# Patient Record
Sex: Female | Born: 1935 | Smoking: Never smoker
Health system: Southern US, Community
[De-identification: ages and names within clinical notes are randomized; demographics above are authoritative.]

## PROBLEM LIST (undated history)

## (undated) DIAGNOSIS — F0390 Unspecified dementia without behavioral disturbance: Secondary | ICD-10-CM

## (undated) HISTORY — PX: ABDOMINAL HYSTERECTOMY: SHX81

---

## 2016-08-25 ENCOUNTER — Inpatient Hospital Stay (HOSPITAL_COMMUNITY): Payer: Medicare Other

## 2016-08-25 ENCOUNTER — Encounter (HOSPITAL_COMMUNITY): Payer: Self-pay

## 2016-08-25 ENCOUNTER — Inpatient Hospital Stay (HOSPITAL_COMMUNITY)
Admission: EM | Admit: 2016-08-25 | Discharge: 2016-09-02 | DRG: 870 | Disposition: E | Payer: Medicare Other | Attending: Pulmonary Disease | Admitting: Pulmonary Disease

## 2016-08-25 ENCOUNTER — Emergency Department (HOSPITAL_COMMUNITY): Payer: Medicare Other

## 2016-08-25 DIAGNOSIS — R402312 Coma scale, best motor response, none, at arrival to emergency department: Secondary | ICD-10-CM | POA: Diagnosis present

## 2016-08-25 DIAGNOSIS — D6959 Other secondary thrombocytopenia: Secondary | ICD-10-CM | POA: Diagnosis not present

## 2016-08-25 DIAGNOSIS — R402212 Coma scale, best verbal response, none, at arrival to emergency department: Secondary | ICD-10-CM | POA: Diagnosis present

## 2016-08-25 DIAGNOSIS — E43 Unspecified severe protein-calorie malnutrition: Secondary | ICD-10-CM | POA: Diagnosis present

## 2016-08-25 DIAGNOSIS — J9601 Acute respiratory failure with hypoxia: Secondary | ICD-10-CM

## 2016-08-25 DIAGNOSIS — Z681 Body mass index (BMI) 19 or less, adult: Secondary | ICD-10-CM

## 2016-08-25 DIAGNOSIS — R402112 Coma scale, eyes open, never, at arrival to emergency department: Secondary | ICD-10-CM | POA: Diagnosis present

## 2016-08-25 DIAGNOSIS — J869 Pyothorax without fistula: Secondary | ICD-10-CM | POA: Diagnosis present

## 2016-08-25 DIAGNOSIS — D6489 Other specified anemias: Secondary | ICD-10-CM | POA: Diagnosis present

## 2016-08-25 DIAGNOSIS — M950 Acquired deformity of nose: Secondary | ICD-10-CM | POA: Diagnosis present

## 2016-08-25 DIAGNOSIS — A419 Sepsis, unspecified organism: Principal | ICD-10-CM

## 2016-08-25 DIAGNOSIS — R739 Hyperglycemia, unspecified: Secondary | ICD-10-CM | POA: Diagnosis present

## 2016-08-25 DIAGNOSIS — Z66 Do not resuscitate: Secondary | ICD-10-CM | POA: Diagnosis present

## 2016-08-25 DIAGNOSIS — Z515 Encounter for palliative care: Secondary | ICD-10-CM | POA: Diagnosis present

## 2016-08-25 DIAGNOSIS — I480 Paroxysmal atrial fibrillation: Secondary | ICD-10-CM | POA: Diagnosis not present

## 2016-08-25 DIAGNOSIS — R0600 Dyspnea, unspecified: Secondary | ICD-10-CM | POA: Diagnosis not present

## 2016-08-25 DIAGNOSIS — Z90710 Acquired absence of both cervix and uterus: Secondary | ICD-10-CM | POA: Diagnosis not present

## 2016-08-25 DIAGNOSIS — T68XXXA Hypothermia, initial encounter: Secondary | ICD-10-CM | POA: Diagnosis present

## 2016-08-25 DIAGNOSIS — G9341 Metabolic encephalopathy: Secondary | ICD-10-CM | POA: Diagnosis present

## 2016-08-25 DIAGNOSIS — E871 Hypo-osmolality and hyponatremia: Secondary | ICD-10-CM | POA: Diagnosis present

## 2016-08-25 DIAGNOSIS — F0390 Unspecified dementia without behavioral disturbance: Secondary | ICD-10-CM | POA: Diagnosis present

## 2016-08-25 DIAGNOSIS — R64 Cachexia: Secondary | ICD-10-CM | POA: Diagnosis present

## 2016-08-25 DIAGNOSIS — Z9889 Other specified postprocedural states: Secondary | ICD-10-CM

## 2016-08-25 DIAGNOSIS — J90 Pleural effusion, not elsewhere classified: Secondary | ICD-10-CM

## 2016-08-25 DIAGNOSIS — E872 Acidosis, unspecified: Secondary | ICD-10-CM

## 2016-08-25 DIAGNOSIS — Z4659 Encounter for fitting and adjustment of other gastrointestinal appliance and device: Secondary | ICD-10-CM

## 2016-08-25 DIAGNOSIS — J9690 Respiratory failure, unspecified, unspecified whether with hypoxia or hypercapnia: Secondary | ICD-10-CM | POA: Diagnosis present

## 2016-08-25 DIAGNOSIS — E874 Mixed disorder of acid-base balance: Secondary | ICD-10-CM | POA: Diagnosis present

## 2016-08-25 DIAGNOSIS — E876 Hypokalemia: Secondary | ICD-10-CM | POA: Diagnosis not present

## 2016-08-25 DIAGNOSIS — J939 Pneumothorax, unspecified: Secondary | ICD-10-CM

## 2016-08-25 DIAGNOSIS — R4189 Other symptoms and signs involving cognitive functions and awareness: Secondary | ICD-10-CM

## 2016-08-25 DIAGNOSIS — J690 Pneumonitis due to inhalation of food and vomit: Secondary | ICD-10-CM | POA: Diagnosis present

## 2016-08-25 DIAGNOSIS — R6521 Severe sepsis with septic shock: Secondary | ICD-10-CM | POA: Diagnosis present

## 2016-08-25 DIAGNOSIS — J9602 Acute respiratory failure with hypercapnia: Secondary | ICD-10-CM

## 2016-08-25 DIAGNOSIS — J9600 Acute respiratory failure, unspecified whether with hypoxia or hypercapnia: Secondary | ICD-10-CM | POA: Diagnosis present

## 2016-08-25 HISTORY — DX: Unspecified dementia, unspecified severity, without behavioral disturbance, psychotic disturbance, mood disturbance, and anxiety: F03.90

## 2016-08-25 LAB — BODY FLUID CELL COUNT WITH DIFFERENTIAL
Lymphs, Fluid: 1 %
MONOCYTE-MACROPHAGE-SEROUS FLUID: 2 % — AB (ref 50–90)
Neutrophil Count, Fluid: 97 % — ABNORMAL HIGH (ref 0–25)
Total Nucleated Cell Count, Fluid: 8531 cu mm — ABNORMAL HIGH (ref 0–1000)

## 2016-08-25 LAB — URINALYSIS, ROUTINE W REFLEX MICROSCOPIC
BILIRUBIN URINE: NEGATIVE
Glucose, UA: >=500 mg/dL — AB
KETONES UR: NEGATIVE mg/dL
Nitrite: NEGATIVE
Protein, ur: NEGATIVE mg/dL
Specific Gravity, Urine: 1.01 (ref 1.005–1.030)
pH: 6 (ref 5.0–8.0)

## 2016-08-25 LAB — BLOOD GAS, ARTERIAL
Acid-base deficit: 10.3 mmol/L — ABNORMAL HIGH (ref 0.0–2.0)
Acid-base deficit: 3 mmol/L — ABNORMAL HIGH (ref 0.0–2.0)
BICARBONATE: 20.8 mmol/L (ref 20.0–28.0)
Bicarbonate: 22 mmol/L (ref 20.0–28.0)
Drawn by: 441261
FIO2: 100
FIO2: 40
LHR: 15 {breaths}/min
O2 SAT: 93.2 %
O2 Saturation: 98.4 %
PEEP: 5 cmH2O
PEEP: 5 cmH2O
PH ART: 7.339 — AB (ref 7.350–7.450)
PO2 ART: 252 mmHg — AB (ref 83.0–108.0)
Patient temperature: 95
Patient temperature: 99
Pressure control: 20 cmH2O
RATE: 30 resp/min
VT: 420 mL
pCO2 arterial: 67.4 mmHg (ref 32.0–48.0)
pCO2 arterial: 42.2 mmHg (ref 32.0–48.0)
pH, Arterial: 7.1 — CL (ref 7.350–7.450)
pO2, Arterial: 70.9 mmHg — ABNORMAL LOW (ref 83.0–108.0)

## 2016-08-25 LAB — GLUCOSE, CAPILLARY
GLUCOSE-CAPILLARY: 187 mg/dL — AB (ref 65–99)
GLUCOSE-CAPILLARY: 119 mg/dL — AB (ref 65–99)
GLUCOSE-CAPILLARY: 75 mg/dL (ref 65–99)
Glucose-Capillary: 86 mg/dL (ref 65–99)

## 2016-08-25 LAB — PROCALCITONIN: PROCALCITONIN: 22.68 ng/mL

## 2016-08-25 LAB — CBC WITH DIFFERENTIAL/PLATELET
BAND NEUTROPHILS: 19 %
BASOS ABS: 0 10*3/uL (ref 0.0–0.1)
BLASTS: 0 %
Basophils Relative: 0 %
Eosinophils Absolute: 0 10*3/uL (ref 0.0–0.7)
Eosinophils Relative: 0 %
HEMATOCRIT: 36 % (ref 36.0–46.0)
Hemoglobin: 11.5 g/dL — ABNORMAL LOW (ref 12.0–15.0)
LYMPHS PCT: 2 %
Lymphs Abs: 1.1 10*3/uL (ref 0.7–4.0)
MCH: 30.8 pg (ref 26.0–34.0)
MCHC: 31.9 g/dL (ref 30.0–36.0)
MCV: 96.5 fL (ref 78.0–100.0)
Metamyelocytes Relative: 0 %
Monocytes Absolute: 0.5 10*3/uL (ref 0.1–1.0)
Monocytes Relative: 1 %
Myelocytes: 2 %
NEUTROS ABS: 51.1 10*3/uL — AB (ref 1.7–7.7)
NEUTROS PCT: 76 %
PROMYELOCYTES ABS: 0 %
Platelets: 130 10*3/uL — ABNORMAL LOW (ref 150–400)
RBC: 3.73 MIL/uL — ABNORMAL LOW (ref 3.87–5.11)
RDW: 13.5 % (ref 11.5–15.5)
WBC Morphology: INCREASED
WBC: 52.7 10*3/uL — AB (ref 4.0–10.5)
nRBC: 0 /100 WBC

## 2016-08-25 LAB — I-STAT CHEM 8, ED
BUN: 29 mg/dL — AB (ref 6–20)
CALCIUM ION: 1.02 mmol/L — AB (ref 1.15–1.40)
CREATININE: 0.7 mg/dL (ref 0.44–1.00)
Chloride: 102 mmol/L (ref 101–111)
GLUCOSE: 269 mg/dL — AB (ref 65–99)
HCT: 35 % — ABNORMAL LOW (ref 36.0–46.0)
Hemoglobin: 11.9 g/dL — ABNORMAL LOW (ref 12.0–15.0)
Potassium: 4.4 mmol/L (ref 3.5–5.1)
Sodium: 132 mmol/L — ABNORMAL LOW (ref 135–145)
TCO2: 20 mmol/L — ABNORMAL LOW (ref 22–32)

## 2016-08-25 LAB — COMPREHENSIVE METABOLIC PANEL
ALT: 25 U/L (ref 14–54)
AST: 43 U/L — AB (ref 15–41)
Albumin: 2.1 g/dL — ABNORMAL LOW (ref 3.5–5.0)
Alkaline Phosphatase: 93 U/L (ref 38–126)
Anion gap: 10 (ref 5–15)
BILIRUBIN TOTAL: 1.3 mg/dL — AB (ref 0.3–1.2)
BUN: 24 mg/dL — AB (ref 6–20)
CO2: 22 mmol/L (ref 22–32)
CREATININE: 0.75 mg/dL (ref 0.44–1.00)
Calcium: 8.4 mg/dL — ABNORMAL LOW (ref 8.9–10.3)
Chloride: 102 mmol/L (ref 101–111)
GFR calc Af Amer: >60 mL/min (ref >60)
Glucose, Bld: 274 mg/dL — ABNORMAL HIGH (ref 65–99)
POTASSIUM: 4.2 mmol/L (ref 3.5–5.1)
Sodium: 134 mmol/L — ABNORMAL LOW (ref 135–145)
TOTAL PROTEIN: 4.9 g/dL — AB (ref 6.5–8.1)

## 2016-08-25 LAB — I-STAT TROPONIN, ED: TROPONIN I, POC: 0.02 ng/mL (ref 0.00–0.08)

## 2016-08-25 LAB — CBG MONITORING, ED: Glucose-Capillary: 301 mg/dL — ABNORMAL HIGH (ref 65–99)

## 2016-08-25 LAB — MAGNESIUM
Magnesium: 2.8 mg/dL — ABNORMAL HIGH (ref 1.7–2.4)
Magnesium: 2.8 mg/dL — ABNORMAL HIGH (ref 1.7–2.4)
Magnesium: 2.7 mg/dL — ABNORMAL HIGH (ref 1.7–2.4)

## 2016-08-25 LAB — GLUCOSE, PLEURAL OR PERITONEAL FLUID: Glucose, Fluid: 30 mg/dL

## 2016-08-25 LAB — LACTATE DEHYDROGENASE, PLEURAL OR PERITONEAL FLUID: LD FL: 1028 U/L — AB (ref 3–23)

## 2016-08-25 LAB — FOLATE: Folate: 27.6 ng/mL (ref >5.9)

## 2016-08-25 LAB — I-STAT CG4 LACTIC ACID, ED: Lactic Acid, Venous: 6.74 mmol/L (ref 0.5–1.9)

## 2016-08-25 LAB — TROPONIN I
Troponin I: <0.03 ng/mL (ref <0.03)
Troponin I: 0.03 ng/mL (ref <0.03)

## 2016-08-25 LAB — CORTISOL: Cortisol, Plasma: 73.5 ug/dL

## 2016-08-25 LAB — PROTEIN, PLEURAL OR PERITONEAL FLUID: Total protein, fluid: 4 g/dL

## 2016-08-25 LAB — PHOSPHORUS
PHOSPHORUS: 4.2 mg/dL (ref 2.5–4.6)
PHOSPHORUS: 3.3 mg/dL (ref 2.5–4.6)

## 2016-08-25 LAB — LACTIC ACID, PLASMA: LACTIC ACID, VENOUS: 3.8 mmol/L — AB (ref 0.5–1.9)

## 2016-08-25 LAB — VITAMIN B12: Vitamin B-12: 278 pg/mL (ref 180–914)

## 2016-08-25 LAB — TSH: TSH: 2.468 u[IU]/mL (ref 0.350–4.500)

## 2016-08-25 LAB — MRSA PCR SCREENING: MRSA BY PCR: NEGATIVE

## 2016-08-25 MED ORDER — ETOMIDATE 2 MG/ML IV SOLN
INTRAVENOUS | Status: AC | PRN
  Administered 2016-08-25: 10 mg via INTRAVENOUS

## 2016-08-25 MED ORDER — VANCOMYCIN HCL IN DEXTROSE 1-5 GM/200ML-% IV SOLN
1000.0000 mg | Freq: Once | INTRAVENOUS | Status: AC
  Administered 2016-08-25: 1000 mg via INTRAVENOUS
  Filled 2016-08-25: qty 200

## 2016-08-25 MED ORDER — MIDAZOLAM HCL 2 MG/2ML IJ SOLN: 1.0000 mg | INTRAMUSCULAR | Status: DC | PRN

## 2016-08-25 MED ORDER — SODIUM CHLORIDE 0.9 % IV BOLUS (SEPSIS)
1000.0000 mL | Freq: Once | INTRAVENOUS | Status: AC
  Administered 2016-08-25: 1000 mL via INTRAVENOUS

## 2016-08-25 MED ORDER — LACTATED RINGERS IV SOLN
INTRAVENOUS | Status: DC
  Administered 2016-08-25 – 2016-09-01 (×5): via INTRAVENOUS

## 2016-08-25 MED ORDER — ORAL CARE MOUTH RINSE
15.0000 mL | Freq: Four times a day (QID) | OROMUCOSAL | Status: DC
Start: 2016-08-25 — End: 2016-09-01
  Administered 2016-08-26 – 2016-09-01 (×25): 15 mL via OROMUCOSAL

## 2016-08-25 MED ORDER — PIPERACILLIN-TAZOBACTAM 3.375 G IVPB: 3.3750 g | Freq: Once | INTRAVENOUS | Status: DC

## 2016-08-25 MED ORDER — ROCURONIUM BROMIDE 50 MG/5ML IV SOLN
INTRAVENOUS | Status: AC | PRN
  Administered 2016-08-25: 50 mg via INTRAVENOUS

## 2016-08-25 MED ORDER — SODIUM CHLORIDE 0.9 % IV BOLUS (SEPSIS)
500.0000 mL | Freq: Once | INTRAVENOUS | Status: AC
  Administered 2016-08-25: 500 mL via INTRAVENOUS

## 2016-08-25 MED ORDER — FUROSEMIDE 10 MG/ML IJ SOLN
40.0000 mg | Freq: Once | INTRAMUSCULAR | Status: AC
Start: 2016-08-25 — End: 2016-08-25
  Administered 2016-08-25: 40 mg via INTRAVENOUS

## 2016-08-25 MED ORDER — PANTOPRAZOLE SODIUM 40 MG IV SOLR: 40.0000 mg | Freq: Every day | INTRAVENOUS | Status: DC

## 2016-08-25 MED ORDER — INSULIN ASPART 100 UNIT/ML ~~LOC~~ SOLN
0.0000 [IU] | SUBCUTANEOUS | Status: DC
  Administered 2016-08-26 – 2016-08-27 (×2): 2 [IU] via SUBCUTANEOUS
  Administered 2016-08-27 – 2016-08-28 (×6): 1 [IU] via SUBCUTANEOUS
  Administered 2016-08-28: 2 [IU] via SUBCUTANEOUS
  Administered 2016-08-28: 1 [IU] via SUBCUTANEOUS
  Administered 2016-08-29: 2 [IU] via SUBCUTANEOUS
  Administered 2016-08-29 – 2016-08-30 (×7): 1 [IU] via SUBCUTANEOUS
  Administered 2016-08-30: 2 [IU] via SUBCUTANEOUS
  Administered 2016-08-30 – 2016-08-31 (×4): 1 [IU] via SUBCUTANEOUS
  Administered 2016-08-31 (×2): 2 [IU] via SUBCUTANEOUS
  Administered 2016-08-31 – 2016-09-01 (×3): 1 [IU] via SUBCUTANEOUS

## 2016-08-25 MED ORDER — PRO-STAT SUGAR FREE PO LIQD: 30.0000 mL | Freq: Two times a day (BID) | ORAL | Status: DC

## 2016-08-25 MED ORDER — SODIUM CHLORIDE 0.9 % IV SOLN
500.0000 mg | INTRAVENOUS | Status: DC
  Administered 2016-08-26: 500 mg via INTRAVENOUS
  Filled 2016-08-25: qty 500

## 2016-08-25 MED ORDER — CHLORHEXIDINE GLUCONATE 0.12 % MT SOLN
OROMUCOSAL | Status: AC
  Filled 2016-08-25: qty 15

## 2016-08-25 MED ORDER — NALOXONE HCL 0.4 MG/ML IJ SOLN
INTRAMUSCULAR | Status: DC | PRN
  Administered 2016-08-25: 2 mg via INTRAVENOUS

## 2016-08-25 MED ORDER — LACTATED RINGERS IV BOLUS (SEPSIS)
1000.0000 mL | Freq: Once | INTRAVENOUS | Status: AC
  Administered 2016-08-25: 1000 mL via INTRAVENOUS

## 2016-08-25 MED ORDER — PIPERACILLIN-TAZOBACTAM 3.375 G IVPB
3.3750 g | Freq: Three times a day (TID) | INTRAVENOUS | Status: DC
  Administered 2016-08-25 – 2016-09-01 (×20): 3.375 g via INTRAVENOUS
  Filled 2016-08-25 (×20): qty 50

## 2016-08-25 MED ORDER — CHLORHEXIDINE GLUCONATE 0.12% ORAL RINSE (MEDLINE KIT)
15.0000 mL | Freq: Two times a day (BID) | OROMUCOSAL | Status: DC
  Administered 2016-08-25 – 2016-09-01 (×14): 15 mL via OROMUCOSAL

## 2016-08-25 MED ORDER — FENTANYL CITRATE (PF) 100 MCG/2ML IJ SOLN
50.0000 ug | INTRAMUSCULAR | Status: DC | PRN
Start: 2016-08-25 — End: 2016-09-01
  Administered 2016-08-26: 25 ug via INTRAVENOUS
  Administered 2016-08-28 – 2016-08-29 (×5): 50 ug via INTRAVENOUS
  Filled 2016-08-25 (×6): qty 2

## 2016-08-25 MED ORDER — VITAL AF 1.2 CAL PO LIQD
1000.0000 mL | ORAL | Status: DC
  Filled 2016-08-25: qty 1000

## 2016-08-25 MED ORDER — PIPERACILLIN-TAZOBACTAM 3.375 G IVPB
3.3750 g | Freq: Once | INTRAVENOUS | Status: AC
Start: 2016-08-25 — End: 2016-08-25
  Administered 2016-08-25: 3.375 g via INTRAVENOUS
  Filled 2016-08-25: qty 50

## 2016-08-25 MED ORDER — VITAL HIGH PROTEIN PO LIQD
1000.0000 mL | ORAL | Status: DC
  Filled 2016-08-25: qty 1000

## 2016-08-25 MED ORDER — SODIUM CHLORIDE 0.9 % IV BOLUS (SEPSIS): 1000.0000 mL | Freq: Once | INTRAVENOUS | Status: DC

## 2016-08-25 NOTE — Progress Notes (Signed)
eLink Physician-Brief Progress Note Patient Name: Kentoria Heisler DOB: 17-Feb-1931 MRN: [id number]   Date of Service  23-Sep-2016  HPI/Events of Note  Notified by bedside nurse of hypotension. Patient has peripheral IV access. Peak pressure 32. No pneumothorax ex vacuo. Status post thoracentesis. Currently on mechanical ventilation.   eICU Interventions  1. Repeat portable chest x-ray already ordered for 8 PM 2. Normal saline 1 L bolus now      Intervention Category Major Interventions: Hypotension - evaluation and management  Lawanda Cousins 2016-09-23, 5:07 PM

## 2016-08-25 NOTE — Progress Notes (Signed)
Abg sample drawn by MD at bedside.

## 2016-08-25 NOTE — ED Notes (Signed)
EDP E.S aware of WBC count

## 2016-08-25 NOTE — Progress Notes (Signed)
eLink Physician-Brief Progress Note Patient Name: Tammie Gonzalez DOB: [birth year] MRN: [id number]   Date of Service  08/25/2016  HPI/Events of Note  Diarrhea. Blood pressure improved with IVF bolus.  eICU Interventions  Ordering rectal tube system     Intervention Category Major Interventions: Hypotension - evaluation and management  Lawanda Cousins Sep 01, 2016, 5:58 PM

## 2016-08-25 NOTE — Progress Notes (Signed)
Subjective: Bedside to evaluate patient for elevated peak airway pressures and hypotension in the setting of systems severe sepsis due to pneumonia complicated by parapneumonic effusion possible empyema. Patient is an 83 year old woman who lives with her daughters she moved most recently changed residence from her daughter and rally [place] to [place]. She has a history of what they say his dementia although I don't believe she sees physicians she takes no medications she is severely malnourished and underweight she weighs 30 kg and appears incredibly frail. Temperature 90.1 blood pressure means around the 60s to 70s heart rate 1 teens sinus tachycardia respiratory rate is 30 she is saturating 94% on 40% FiO2 shows ventilator settings are changed during the visit to pressure before meals of 20/5 rate of 30 FiO2 40%.. Lungs have crackles on the right clear on the left no wheezing abdomen is scaphoid soft nontender no masses or hepatosplenomegaly extremities are perfused good capillary refill no edema she responds to voice and follows commands with all extremities.  Objective: Repeat ABG on pressure control is 7.34/42/71/22 mild respiratory and metabolic acidosis CBC showed a white count of 52 hemoglobin 11 platelet 1:30 Lactate was 6.7 and pro-calcitonin was 23. Lactate trended down to 3 TSH is normal chemistry is notable for renal function of the 129 creatinine 0.7  Chest x-ray after right thoracentesis with removal of 1.1 L shows persistent basilar pneumothorax likely a component of triathlon  Bedside ultrasound shows an IVC that is not dilated and does have respirophasic changes but it does not completely collapsed. Her heart has no effusion has good ejection fraction on the last actually the walls appeared to be touching. The right ventricle is smaller than the LV appears appropriately sized. Lung ultrasound (within normal limits except for a small effusion on the left lung  ultrasound the right shows an infiltrate in the mid lung zones normal in the upper lung zones.  Problems: Septic shock secondary to presumed community-acquired pneumonia with parapneumonic effusion possible empyema Severe protein calorie malnutrition Lactic acidosis Non-anion gap metabolic acidosis Respiratory acidosis  Assessment/plan Low tidal volume ventilation at 6 mL per KG of ideal body weight to limit intrathoracic pressure to improve cardiac function. Lasix stress test to evaluate if here and output is low because the kidneys are injured or because of prerenal hypoperfusion. Match urine output from Lasix plus additional 10-30 mL/kg. We'll utilize lactated Ringer's resuscitation due to non-gap acidosis. I spent over 40 minutes in direct face-to-face discussion with the patient's surrogate decision maker, her daughter Santina Evans,. I explained her that based on her baseline debilitated status, severe malnutrition, respiratory failure, circulatory failure, septic shock there is a high likelihood of mortality. Santina Evans would like to discuss more with her sister who is in [place] and will be coming tonight or tomorrow. They would like to avoid invasive procedures such as central lines dialysis catheters but would like to discuss the situation more if the need arises. No change in goals of care at this time.  Upon my evaluation, this patient had a high probability of imminent or life-threatening deterioration due to septic shock  high complexity decision making to assess, manipulate, and support vital organ system failure including mechanical ventilation, resuscitation, bedside ultrasound  I have personally provided 60 minutes of critical care time exclusive of time spent on separately billable procedures and education. Time includes review and summation of previous medical record, laboratory data, radiology results, independent review of chest x-ray, bedside ultrasound, coordination of care with  RN,  updating the surrogate decision maker, having goals of care discussionsand monitoring for potential decompensation. Interventions were performed as documented above.  Condition: critical Prognosis: poor Code Status: full  Caro Laroche, MD Critical Care Medicine [facility] Pager: [phone]

## 2016-08-25 NOTE — H&P (Signed)
PULMONARY / CRITICAL CARE MEDICINE   Name: Tammie Gonzalez MRN: [id number] DOB: 1931/10/22    ADMISSION DATE:  08/31/2016 CONSULTATION DATE:  08/03/2016  REFERRING MD:  Dr. Dalene Seltzer / EDP   CHIEF COMPLAINT:  AMS  HISTORY OF PRESENT ILLNESS:   83 y/o F who presented to [facility] ER on 8/24 with reports of altered mental status.  At baseline, the patient lives with her daughter and has a history of advanced dementia and prior HTN (not on meds now).  She is typically carried to the bathroom by family.  The patient will also have periods where she will stay up for up to 3 days in a row.  She was up eating breakfast this am and told her daughter that she needed more coffee to wash her food down as she felt it was stuck.  Her daughter was on the phone and then noted that the patient slumped over. She heard the patient gurgling and EMS was activated. The patient has two daughters.  On admit, one requested DNR & the other wanted full code per ER staff.  She was given 125 mg solumedrol, IVF's and BVM via EMS.  In the ER, she was intubated for airway protection in the setting of AMS.  Initial labs- Na 132, K 4.4, Cl 102, glucose 102, BUN 29 / sr Cr 0.70, troponin 0.02, lactic acid 6.74, Hgb 11.9, Hct 35.  CXR demonstrated a right effusion vs infiltrate, left clear.  She was hypothermic and bair hugger initiated.  IV vanco / zosyn initiated.    PCCM consulted for admit.   PAST MEDICAL HISTORY :  She  has no past medical history on file.  PAST SURGICAL HISTORY: She  has a past surgical history that includes Abdominal hysterectomy.  No Known Allergies  No current facility-administered medications on file prior to encounter.    No current outpatient prescriptions on file prior to encounter.    FAMILY HISTORY:  Her has no family status information on file.    SOCIAL HISTORY: She  reports that she has never smoked. She has never used smokeless tobacco. She reports that she does not drink alcohol or use  drugs.  REVIEW OF SYSTEMS:   Unable to complete as patient is altered on mechanical ventilation.    SUBJECTIVE:    VITAL SIGNS: BP (!) 93/57   Pulse 96   Temp (!) 95.4 F (35.2 C) (Rectal)   Resp 18   SpO2 100%   HEMODYNAMICS:    VENTILATOR SETTINGS:    INTAKE / OUTPUT: No intake/output data recorded.  PHYSICAL EXAMINATION: General: cachectic elderly female, critically ill appearing on mechanical ventilation   HEENT: MM pink/moist, ETT, nasal bridge deformity Neuro: obtunded, pupils 2mm CV: s1s2 rrr, no m/r/g PULM: even/non-labored, lungs bilaterally with coarse rhonchi R>L  WU:JWJX, non-tender, bsx4 active  Extremities: warm/dry, no edema  Skin: no rashes or lesions.  Dry flaky skin. No open wounds on anterior skin.    LABS:  BMET  Recent Labs Lab 08/23/2016 1205 08/04/2016 1214  NA 134* 132*  K 4.2 4.4  CL 102 102  CO2 22  --   BUN 24* 29*  CREATININE 0.75 0.70  GLUCOSE 274* 269*    Electrolytes  Recent Labs Lab 08/06/2016 1205  CALCIUM 8.4*  MG 2.8*    CBC  Recent Labs Lab 08/12/2016 1214  HGB 11.9*  HCT 35.0*    Coag's No results for input(s): APTT, INR in the last 168 hours.  Sepsis Markers  Recent Labs Lab 08-27-2016 1215  LATICACIDVEN 6.74*    ABG  Recent Labs Lab 08-27-2016 1210  PHART 7.100*  PCO2ART 67.4*  PO2ART 252*    Liver Enzymes  Recent Labs Lab 2016/08/27 1205  AST 43*  ALT 25  ALKPHOS 93  BILITOT 1.3*  ALBUMIN 2.1*    Cardiac Enzymes No results for input(s): TROPONINI, PROBNP in the last 168 hours.  Glucose  Recent Labs Lab 08-27-2016 1041  GLUCAP 301*    Imaging Ct Head Wo Contrast  Result Date: August 27, 2016 CLINICAL DATA:  Altered level of consciousness EXAM: CT HEAD WITHOUT CONTRAST TECHNIQUE: Contiguous axial images were obtained from the base of the skull through the vertex without intravenous contrast. COMPARISON:  None. FINDINGS: Brain: There is mild to moderate diffuse atrophy. There is no  intracranial mass, hemorrhage, extra-axial fluid collection, or midline shift. There is small vessel disease in the centra semiovale bilaterally. There is evidence of a prior infarct at the gray - white junction of the right occipital lobe near the atrium the right lateral ventricle. There is a prior infarct involving a portion of the anterior limb of the left internal capsule in the adjacent anterior lentiform nucleus. No acute infarct is appreciable. Vascular: There is no hyperdense vessel. There is calcification in each carotid siphon region. Skull: The bony calvarium appears intact. Sinuses/Orbits: There is mucosal thickening in several ethmoid air cells bilaterally. Paranasal sinuses elsewhere are clear. There is rightward deviation of the nasal septum. Orbits appear symmetric bilaterally. Other: Mastoid air cells are clear. IMPRESSION: Atrophy with periventricular small vessel disease bilaterally. Prior infarct at the gray- white compartment junction of the midportion of the upper right occipital lobe. Prior infarct involving a portion of the left anterior lentiform nucleus and adjacent anterior limb left internal capsule. No acute infarct evident. No intracranial mass, hemorrhage, or extra-axial fluid collection. There are foci of arterial vascular calcification. There is mucosal thickening in several ethmoid air cells. There is rightward deviation of the nasal septum. Electronically Signed   By: Bretta Bang III M.D.   On: 08-27-16 12:05   Dg Chest Portable 1 View  Result Date: 27-Aug-2016 CLINICAL DATA:  Endotracheal tube placement. EXAM: PORTABLE CHEST 1 VIEW COMPARISON:  None. FINDINGS: Endotracheal tube is in good position. NG tube tip is below the diaphragm. Large right pleural effusion. Heart size is normal. Pulmonary vascularity appears slightly prominent. 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 IMPRESSION: 1. Endotracheal tube in good position. 2. Large right pleural effusion.  3. Pulmonary vascular congestion. Electronically Signed   By: Francene Boyers M.D.   On: 2016/08/27 11:37   Dg Abd Portable 1v  Result Date: 08-27-2016 CLINICAL DATA:  OG tube placement. EXAM: PORTABLE ABDOMEN - 1 VIEW COMPARISON:  None. FINDINGS: OG tube tip is in the body of the stomach. There is no appreciable free air in the abdomen. Numerous gallstones. Large right pleural effusion. Pulmonary vascular congestion. IMPRESSION: OG tube tip in the body of the stomach. No appreciable free air in the abdomen. Cholelithiasis. Large right pleural effusion. Pulmonary vascular congestion. Electronically Signed   By: Francene Boyers M.D.   On: 08-27-2016 11:39     STUDIES:  CT Head 8/24 >> atrophy with periventricular small vessel disease bilaterally, prior infarct at the gray-white compartment junction of the midportion of the upper right occipital lobe, prior infarct involving a portion of the left anterior lentiform nucleus and adjacent anterior limb left internal capsule.  No evidence of acute infarct, mass,  hemorrhage or fluid collection.    CULTURES: BCx2 8/24 >>  UC 8/24 >>  Sputum 8/24 >> RPR 8/24 >>   ANTIBIOTICS: Vancomycin (empiric) 8/24 >>  Zosyn (empiric) 8/24 >>   SIGNIFICANT EVENTS: 8/24  Admit with AMS, intubated   LINES/TUBES: ETT 8/24 >>   DISCUSSION: 83 y/o F with severe dementia admitted 8/24 with AMS requiring intubation.  Concern for possible aspiration event with breakfast.  Family undecided regarding goals of care.    ASSESSMENT / PLAN:  PULMONARY A: Acute Hypoxic Respiratory Failure  Right Opacity - effusion vs possible aspiration event prior to admit P:   PRVC 8 cc/kg  Wean PEEP / FiO2 for sats > 92% Intermittent CXR  ABX as above Assess right pleural space with [place] ABG in one hour  CARDIOVASCULAR A:  Former HTN - now not on medications P:  ICU monitoring Assess ECHO   Trend Troponin  LR at 50 ml/hr  RENAL A:   Hyponatremia  Lactic Acidosis   P:   Trend BMP / urinary output Replace electrolytes as indicated Avoid nephrotoxic agents, ensure adequate renal perfusion  GASTROINTESTINAL A:   Moderate to Severe Protein Calorie Malnutrition  P:   NPO  OGT  Begin TF Protonix for SUP   HEMATOLOGIC A:   Anemia - mild, no evidence of bleeding  P:  Trend CBC  SCD's for DVT prophylaxis  Consider heparin for DVT in am pending mental status review   INFECTIOUS A:   Possible Aspiration PNA Nasal Deformity - r/o tertiary syphilis  P:   Empiric antibiotics as above Assess RPR  Pan culture   ENDOCRINE A:   Hyperglycemia  P:   CBG Q4 with SSI   NEUROLOGIC A:   Acute Encephalopathy - suspect underlying metabolic process.  CT head negative for acute process.   P:   RASS goal: n/a  Minimize sedation  Assess folate, B12, RPR, TSH PRN Fentanyl / Versed only   FAMILY  - Updates: Daughter updated at bedside per Dr. Craige Cotta  - Inter-disciplinary family meet or Palliative Care meeting due by:  8/31  - GOC:  Discussed with family at bedside.  The daughters want to continue discussion regarding plan of care.  Will need to follow up.   Canary Brim, NP-C [facility] Pgr: [phone] or if no answer [phone] 09/16/16, 1:05 PM

## 2016-08-25 NOTE — ED Provider Notes (Signed)
[facility] DEPT Provider Note   CSN: [id number] Arrival date & time: 08/16/2016  1032     History   Chief Complaint Chief Complaint  Patient presents with  . Respiratory Distress    HPI Tammie Gonzalez is a 83 y.o. female.  HPI   Presents with concern for unresponsiveness, shallow respirations.  Per daughter she has hx of dementia, is awake a lot of the nights talking to herself. This morning, woke up, seemed to be herself, sat down to have breakfast.  Reports as she was eating breakfast she said she had a hard time swallowing, and she wanted to drink some coffee to help get it down. She then just stared, become unresponsive. Daughter reports her eyes were open but she went down, not responding to her at all. She was breathing but with shallow respirations. EMS came to house with daughter requesting to do everything. They attempted intubation without success, however used BVM en route. VS normal.  Daughter denies her having signs of slurred speech, facial droop, numbness/weakness one side or the other.  Reports she did have worsening cough, rattling sounding breathing worse over last 2 days.  Patient had reported last night "i think i'm getting pneumonia" but daughter reports she is not a good historian. Denies fevers, recent trauma, vomiting, diarrhea, known chest pain or headaches.   History of dementia, asthma as a child,  Used to have htn but not any more  Not on blood thinners, doesn't take any medications per daughter  Daughter catherine reports her sister is POA, and previously her mother had signed a DNR with her sister, however reports she does not want her to be DNR and confirmed with her sister on the phone who would like to discuss together.    Past Medical History:  Diagnosis Date  . Dementia     Patient Active Problem List   Diagnosis Date Noted  . Acute respiratory failure ([place]) 08/15/2016  . Protein-calorie malnutrition, severe 08/19/2016  . Pleural effusion      Past Surgical History:  Procedure Laterality Date  . ABDOMINAL HYSTERECTOMY      OB History    No data available       Home Medications    Prior to Admission medications   Medication Sig Start Date End Date Taking? Authorizing Provider  aspirin 81 MG chewable tablet Chew 162 mg by mouth every 6 (six) hours as needed for mild pain, moderate pain, fever or headache.   Yes [provider]    Family History Family History  Problem Relation Age of Onset  . Family history unknown: Yes    Social History Social History  Substance Use Topics  . Smoking status: Never Smoker  . Smokeless tobacco: Never Used  . Alcohol use No     Allergies   Patient has no known allergies.   Review of Systems Review of Systems  Unable to perform ROS: Mental status change (ROS from daughter's history)  Constitutional: Positive for fatigue. Negative for fever.  HENT: Positive for trouble swallowing.   Eyes: Negative for visual disturbance.  Respiratory: Positive for cough and shortness of breath.   Cardiovascular: Negative for chest pain.  Gastrointestinal: Negative for abdominal pain, diarrhea, nausea and vomiting.  Genitourinary: Negative for difficulty urinating.  Skin: Negative for rash.  Neurological: Negative for syncope, facial asymmetry, speech difficulty, weakness, numbness and headaches.     Physical Exam Updated Vital Signs BP (!) 126/41 (BP Location: Right Arm)   Pulse (!) 111  Temp 99.5 F (37.5 C)   Resp (!) 29   Ht _0  (1.651 m)   Wt 30.9 kg (68 lb 2 oz)   SpO2 97%   BMI 11.34 kg/m   Physical Exam  Constitutional: She appears cachectic. She appears toxic. She has a sickly appearance. She appears ill. No distress.  HENT:  Head: Normocephalic and atraumatic.  Eyes: Pupils are equal, round, and reactive to light. Conjunctivae are normal.  Neck: Normal range of motion.  Cardiovascular: Normal rate, regular rhythm, normal heart sounds and intact  distal pulses.  Exam reveals no gallop and no friction rub.   No murmur heard. Pulmonary/Chest: Accessory muscle usage present. No respiratory distress (shallow respirations, assisted by BVM). She has decreased breath sounds.  Abdominal: Soft. She exhibits no distension. There is no tenderness. There is no guarding.  Musculoskeletal: She exhibits no edema or tenderness.  Neurological: She is unresponsive. GCS eye subscore is 1. GCS verbal subscore is 1. GCS motor subscore is 1.  Skin: Skin is warm and dry. No rash noted. She is not diaphoretic. No erythema.  Nursing note and vitals reviewed.    ED Treatments / Results  Labs (all labs ordered are listed, but only abnormal results are displayed) Labs Reviewed  CBC WITH DIFFERENTIAL/PLATELET - Abnormal; Notable for the following:       Result Value   WBC 52.7 (*)    RBC 3.73 (*)    Hemoglobin 11.5 (*)    Platelets 130 (*)    Neutro Abs 51.1 (*)    All other components within normal limits  COMPREHENSIVE METABOLIC PANEL - Abnormal; Notable for the following:    Sodium 134 (*)    Glucose, Bld 274 (*)    BUN 24 (*)    Calcium 8.4 (*)    Total Protein 4.9 (*)    Albumin 2.1 (*)    AST 43 (*)    Total Bilirubin 1.3 (*)    All other components within normal limits  MAGNESIUM - Abnormal; Notable for the following:    Magnesium 2.8 (*)    All other components within normal limits  URINALYSIS, ROUTINE W REFLEX MICROSCOPIC - Abnormal; Notable for the following:    Glucose, UA >=500 (*)    Hgb urine dipstick SMALL (*)    Leukocytes, UA SMALL (*)    Bacteria, UA MANY (*)    Squamous Epithelial / LPF 0-5 (*)    All other components within normal limits  BLOOD GAS, ARTERIAL - Abnormal; Notable for the following:    pH, Arterial 7.100 (*)    pCO2 arterial 67.4 (*)    pO2, Arterial 252 (*)    Acid-base deficit 10.3 (*)    All other components within normal limits  TROPONIN I - Abnormal; Notable for the following:    Troponin I 0.03 (*)     All other components within normal limits  MAGNESIUM - Abnormal; Notable for the following:    Magnesium 2.8 (*)    All other components within normal limits  MAGNESIUM - Abnormal; Notable for the following:    Magnesium 2.7 (*)    All other components within normal limits  BODY FLUID CELL COUNT WITH DIFFERENTIAL - Abnormal; Notable for the following:    Appearance, Fluid CLOUDY (*)    WBC, Fluid 8,531 (*)    Neutrophil Count, Fluid 97 (*)    Monocyte-Macrophage-Serous Fluid 2 (*)    All other components within normal limits  LACTATE DEHYDROGENASE, PLEURAL OR  PERITONEAL FLUID - Abnormal; Notable for the following:    LD, Fluid 1,028 (*)    All other components within normal limits  GLUCOSE, CAPILLARY - Abnormal; Notable for the following:    Glucose-Capillary 187 (*)    All other components within normal limits  LACTIC ACID, PLASMA - Abnormal; Notable for the following:    Lactic Acid, Venous 3.8 (*)    All other components within normal limits  GLUCOSE, CAPILLARY - Abnormal; Notable for the following:    Glucose-Capillary 119 (*)    All other components within normal limits  BLOOD GAS, ARTERIAL - Abnormal; Notable for the following:    pH, Arterial 7.339 (*)    pO2, Arterial 70.9 (*)    Acid-base deficit 3.0 (*)    All other components within normal limits  CBG MONITORING, ED - Abnormal; Notable for the following:    Glucose-Capillary 301 (*)    All other components within normal limits  I-STAT CG4 LACTIC ACID, ED - Abnormal; Notable for the following:    Lactic Acid, Venous 6.74 (*)    All other components within normal limits  I-STAT CHEM 8, ED - Abnormal; Notable for the following:    Sodium 132 (*)    BUN 29 (*)    Glucose, Bld 269 (*)    Calcium, Ion 1.02 (*)    TCO2 20 (*)    Hemoglobin 11.9 (*)    HCT 35.0 (*)    All other components within normal limits  MRSA PCR SCREENING  CULTURE, BLOOD (ROUTINE X 2)  CULTURE, BLOOD (ROUTINE X 2)  URINE CULTURE   CULTURE, RESPIRATORY (NON-EXPECTORATED)  BODY FLUID CULTURE  TSH  CORTISOL  TROPONIN I  PROCALCITONIN  VITAMIN B12  PHOSPHORUS  PHOSPHORUS  GLUCOSE, PLEURAL OR PERITONEAL FLUID  PROTEIN, PLEURAL OR PERITONEAL FLUID  GLUCOSE, CAPILLARY  TROPONIN I  FOLATE  PH, BODY FLUID  MAGNESIUM  PHOSPHORUS  COMPREHENSIVE METABOLIC PANEL  CBC  PROCALCITONIN  RPR  I-STAT TROPONIN, ED  I-STAT CG4 LACTIC ACID, ED  CYTOLOGY - NON PAP    EKG  EKG Interpretation None       Radiology Ct Head Wo Contrast  Result Date: 08/28/2016 CLINICAL DATA:  Altered level of consciousness EXAM: CT HEAD WITHOUT CONTRAST TECHNIQUE: Contiguous axial images were obtained from the base of the skull through the vertex without intravenous contrast. COMPARISON:  None. FINDINGS: Brain: There is mild to moderate diffuse atrophy. There is no intracranial mass, hemorrhage, extra-axial fluid collection, or midline shift. There is small vessel disease in the centra semiovale bilaterally. There is evidence of a prior infarct at the gray - white junction of the right occipital lobe near the atrium the right lateral ventricle. There is a prior infarct involving a portion of the anterior limb of the left internal capsule in the adjacent anterior lentiform nucleus. No acute infarct is appreciable. Vascular: There is no hyperdense vessel. There is calcification in each carotid siphon region. Skull: The bony calvarium appears intact. Sinuses/Orbits: There is mucosal thickening in several ethmoid air cells bilaterally. Paranasal sinuses elsewhere are clear. There is rightward deviation of the nasal septum. Orbits appear symmetric bilaterally. Other: Mastoid air cells are clear. IMPRESSION: Atrophy with periventricular small vessel disease bilaterally. Prior infarct at the gray- white compartment junction of the midportion of the upper right occipital lobe. Prior infarct involving a portion of the left anterior lentiform nucleus and  adjacent anterior limb left internal capsule. No acute infarct evident. No intracranial mass, hemorrhage,  or extra-axial fluid collection. There are foci of arterial vascular calcification. There is mucosal thickening in several ethmoid air cells. There is rightward deviation of the nasal septum. Electronically Signed   By: Lowella Grip III M.D.   On: 08/11/2016 12:05   Dg Chest Port 1 View  Result Date: 08/30/2016 CLINICAL DATA:  Hypertension. EXAM: PORTABLE CHEST 1 VIEW COMPARISON:  Earlier today. FINDINGS: An approximately 20-25% right basilar pneumothorax has not changed significantly. No mediastinal shift. Mildly progressive patchy airspace opacity in the right upper lung zone without significant change in patchy airspace opacity elsewhere in both lungs. Normal sized heart. Endotracheal tube in satisfactory position. Nasogastric tube tip and side hole in the stomach. Mild scoliosis. Diffuse osteopenia. Stable left humeral neck fracture. IMPRESSION: 1. No significant change in a 20-25% right basilar pneumothorax. 2. Mildly progressive patchy airspace opacity in the right upper lung zone compatible with pneumonia. 3. No significant change in patchy airspace opacity elsewhere in both lungs concerning for pneumonia. 4. Stable left humeral neck fracture. Electronically Signed   By: Claudie Revering M.D.   On: 08/09/2016 19:27   Dg Chest Port 1 View  Result Date: 08/28/2016 CLINICAL DATA:  Patient with history of right pleural effusion status post removal. EXAM: PORTABLE CHEST 1 VIEW COMPARISON:  Chest radiograph 08/11/2016 FINDINGS: ET tube terminates in the mid to distal trachea. Enteric tube courses inferior to the diaphragm. Re- demonstrated diffuse bilateral heterogeneous pulmonary opacities. Persistent small left pleural effusion. Small right pleural effusion. Grossly unchanged small to moderate right basilar pneumothorax. Thoracic spine degenerative changes. Unchanged proximal left humerus fracture.  IMPRESSION: Persistent small to moderate right basilar pneumothorax. Grossly unchanged diffuse bilateral heterogeneous pulmonary opacities which may represent edema or infection. Electronically Signed   By: Lovey Newcomer M.D.   On: 08/08/2016 16:50   Dg Chest Port 1 View  Result Date: 08/30/2016 CLINICAL DATA:  Status post thoracentesis. EXAM: PORTABLE CHEST 1 VIEW COMPARISON:  Chest x-ray from same date. FINDINGS: Endotracheal tube in place with the tip approximately 3.1 cm above the carina. NG tube with tip and distal side port in the stomach. Interval decrease in size of now small right pleural effusion. Small right basal predominant pneumothorax. Trace left pleural effusion is unchanged. Bilateral interstitial predominant pulmonary infiltrates are again noted. Normal heart size. Unchanged nonunited left proximal humerus fracture. IMPRESSION: 1. Interval decrease in size of right pleural effusion, now with small basal pneumothorax, possibly ex vacuo after thoracentesis. 2. Bilateral interstitial predominant pulmonary infiltrates, which may represent edema or infection. Electronically Signed   By: Titus Dubin M.D.   On: 08/09/2016 16:12   Dg Chest Portable 1 View  Result Date: 08/23/2016 CLINICAL DATA:  Endotracheal tube placement. EXAM: PORTABLE CHEST 1 VIEW COMPARISON:  None. FINDINGS: Endotracheal tube is in good position. NG tube tip is below the diaphragm. Large right pleural effusion. Heart size is normal. Pulmonary vascularity appears slightly prominent. 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 IMPRESSION: 1. Endotracheal tube in good position. 2. Large right pleural effusion. 3. Pulmonary vascular congestion. Electronically Signed   By: Lorriane Shire M.D.   On: 08/21/2016 11:37   Dg Abd Portable 1v  Result Date: 08/27/2016 CLINICAL DATA:  OG tube placement. EXAM: PORTABLE ABDOMEN - 1 VIEW COMPARISON:  None. FINDINGS: OG tube tip is in the body of the stomach. There is no  appreciable free air in the abdomen. Numerous gallstones. Large right pleural effusion. Pulmonary vascular congestion. IMPRESSION: OG tube tip in the body  of the stomach. No appreciable free air in the abdomen. Cholelithiasis. Large right pleural effusion. Pulmonary vascular congestion. Electronically Signed   By: Lorriane Shire M.D.   On: 08/10/2016 11:39    Procedures Procedure Name: Intubation Date/Time: 08/12/2016 9:07 PM Performed by: Gareth Morgan Pre-anesthesia Checklist: Patient identified Oxygen Delivery Method: Ambu bag Preoxygenation: Pre-oxygenation with 100% oxygen Induction Type: Rapid sequence Ventilation: Mask ventilation without difficulty Laryngoscope Size: Mac and 3 Grade View: Grade I Tube size: 7.0 mm Number of attempts: 1 Placement Confirmation: ETT inserted through vocal cords under direct vision Secured at: 23 cm Tube secured with: ETT holder Dental Injury: Teeth and Oropharynx as per pre-operative assessment     OG placement Date/Time: 08/16/2016 9:09 PM Performed by: Gareth Morgan Authorized by: Gareth Morgan  Consent: The procedure was performed in an emergent situation. Imaging studies: imaging studies available Required items: required blood products, implants, devices, and special equipment available Time out: Immediately prior to procedure a "time out" was called to verify the correct patient, procedure, equipment, support staff and site/side marked as required. Preparation: Patient was prepped and draped in the usual sterile fashion. Local anesthesia used: no  Anesthesia: Local anesthesia used: no  Sedation: Patient sedated: yes Sedatives: etomidate Vitals: Vital signs were monitored during sedation. Patient tolerance: Patient tolerated the procedure well with no immediate complications Comments: Patient sedated prior to intubation and OG placement with etomidate, although difficult to determine sedation end given pt unresponsive prior  to sedation/procedure    (including critical care time)  Medications Ordered in ED Medications  naloxone [facility]) injection (2 mg Intravenous Given 08/09/2016 1039)  lactated ringers infusion ( Intravenous Rate/Dose Verify 08/20/2016 1700)  chlorhexidine gluconate (MEDLINE KIT) (PERIDEX) 0.12 % solution 15 mL (15 mLs Mouth Rinse Given 08/13/2016 2059)  MEDLINE mouth rinse (not administered)  pantoprazole (PROTONIX) injection 40 mg (40 mg Intravenous Not Given 08/30/2016 1315)  fentaNYL (SUBLIMAZE) injection 50 mcg (not administered)  midazolam (VERSED) injection 1 mg (not administered)  insulin aspart (novoLOG) injection 0-9 Units (0 Units Subcutaneous Not Given 08/20/2016 2059)  feeding supplement (VITAL AF 1.2 CAL) liquid 1,000 mL (1,000 mLs Per Tube Not Given 08/24/2016 2100)  vancomycin (VANCOCIN) 500 mg in sodium chloride 0.9 % 100 mL IVPB (not administered)  piperacillin-tazobactam (ZOSYN) IVPB 3.375 g (3.375 g Intravenous New Bag/Given 08/09/2016 2059)  sodium chloride 0.9 % bolus 1,000 mL (1,000 mLs Intravenous New Bag/Given 08/22/2016 2110)  lactated ringers bolus 1,000 mL (1,000 mLs Intravenous New Bag/Given 08/09/2016 2101)  sodium chloride 0.9 % bolus 1,000 mL (0 mLs Intravenous Stopped 08/31/2016 1214)  sodium chloride 0.9 % bolus 1,000 mL (0 mLs Intravenous Stopped 08/12/2016 1214)  piperacillin-tazobactam (ZOSYN) IVPB 3.375 g (0 g Intravenous Stopped 08/27/2016 1251)  vancomycin (VANCOCIN) IVPB 1000 mg/200 mL premix (0 mg Intravenous Stopped 08/08/2016 1329)  etomidate (AMIDATE) injection (10 mg Intravenous Given 08/24/2016 1049)  rocuronium (ZEMURON) injection (50 mg Intravenous Given 08/07/2016 1049)  sodium chloride 0.9 % bolus 500 mL (500 mLs Intravenous New Bag/Given 08/16/2016 1545)  sodium chloride 0.9 % bolus 1,000 mL (0 mLs Intravenous Stopped 08/09/2016 1733)  sodium chloride 0.9 % bolus 1,000 mL (0 mLs Intravenous Stopped 08/09/2016 1918)  furosemide (LASIX) injection 40 mg (40 mg Intravenous Given 08/24/2016 2057)    chlorhexidine (PERIDEX) 0.12 % solution (  Duplicate 04/25/51 6144)   CRITICAL CARE: respiratory failure, unresonsive Performed by: Alvino Chapel   Total critical care time: 45 minutes  Critical care time was exclusive of separately billable procedures  and treating other patients.  Critical care was necessary to treat or prevent imminent or life-threatening deterioration.  Critical care was time spent personally by me on the following activities: development of treatment plan with patient and/or surrogate as well as nursing, discussions with consultants, evaluation of patient's response to treatment, examination of patient, obtaining history from patient or surrogate, ordering and performing treatments and interventions, ordering and review of laboratory studies, ordering and review of radiographic studies, pulse oximetry and re-evaluation of patient's condition.   Initial Impression / Assessment and Plan / ED Course  I have reviewed the triage vital signs and the nursing notes.  Pertinent labs & imaging results that were available during my care of the patient were reviewed by me and considered in my medical decision making (see chart for details).     83yo female with a history of dementia presents with concern for unresponsiveness with EMS with GCS 3, shallow breathing, BVM in process.  Glucose WNL. No response to narcan.  Family wishes expressed to EMS were that patient is full code.  Patient intubated for airway protection and respiratory failure. CT head shows signs of old infarcts, however no acute findings.  CXR shows right sided pleural effusion, question other underlying opacity.  Lactic acid and WBC elevated.  Unclear etiology of unresponsiveness, encephalopathy, possible sepsis, aspiration, less likely CVA.  Discussed given possible difficulty swallowing, right pleural effusion, esophageal rupture on differential but daughter agrees that she would not want surgery and  given her wishes and low suspicion will not pursue further CT chest imaging at this time.   Given vanc/zosyn.  Discussed patient's care and goals of care with daughter present Barnetta Chapel and daughter Barnett Applebaum on the phone, and at this time they will continue to discuss goals of care together. Consulted critical care.   Final Clinical Impressions(s) / ED Diagnoses   Final diagnoses:  Pleural effusion  Unresponsive  Lactic acidosis  Acute respiratory failure, unspecified whether with hypoxia or hypercapnia [facility])    New Prescriptions Current Discharge Medication List       Gareth Morgan, MD 08/07/2016 2136

## 2016-08-25 NOTE — ED Notes (Signed)
At lunch when order was put in for critical care but called.

## 2016-08-25 NOTE — ED Notes (Signed)
Bed: RESA Expected date:  Expected time:  Means of arrival:  Comments: 83 yo respiratory failure

## 2016-08-25 NOTE — ED Triage Notes (Signed)
Per EMS, patients family called as patient was less responsive and agonal breathing. 2 daughters were with and 1 wanted DNR and the other didn't, pt kept full code. No signed document present. 2mg  mag, 125 solumedrol and 300IVF given. Unsuccessful OPA, patient bagged during transport.

## 2016-08-25 NOTE — Procedures (Signed)
Thoracentesis Procedure Note  Pre-operative Diagnosis: Right Pleural Effusion   Post-operative Diagnosis: same  Indications:  Diagnostic evaluation of pleural fluid and therapeutic relief of dyspnea.   Procedure Details  Consent: Informed consent was obtained. Risks of the procedure were discussed including: infection, bleeding, pain, pneumothorax.  Under sterile conditions the patient was positioned. Betadine solution and sterile drapes were utilized.  1% plain lidocaine was used to anesthetize the 8th rib space. Fluid was obtained without any difficulties and minimal blood loss.  A dressing was applied to the wound and wound care instructions were provided.   Findings 1100 ml of cloudy brown pleural fluid was obtained. A sample was sent for gram stain, culture, LDH, protein and cytology.   Complications:  None; patient tolerated the procedure well.          Condition: stable  Plan A follow up chest x-ray was ordered.   Procedure performed under direct supervision of Dr. Craige Cotta and with ultrasound guidance.      Tammie Brim, NP-C [facility] Pgr: [phone] or if no answer [phone] Sep 09, 2016, 3:46 PM

## 2016-08-25 NOTE — ED Notes (Signed)
Attempted report to ICU, RN will call back .

## 2016-08-25 NOTE — ED Notes (Signed)
In and out cath not completed, got clean catch urine sample.

## 2016-08-25 NOTE — Progress Notes (Signed)
Initial Nutrition Assessment  DOCUMENTATION CODES:   Severe malnutrition in context of chronic illness, Underweight  INTERVENTION:  - Will order Vital AF 1.2 @ 10 mL/hr advance by 10 mL every 12 hours to reach goal rate of Vital AF 1.2 @ 25 mL/hr. At goal rate, this regimen will provide 720 kcal, 45 grams of protein, and 487 mL free water.   Monitor magnesium, potassium, and phosphorus daily for at least 3 days, MD to replete as needed, as pt is at risk for refeeding syndrome given severe malnutrition, unsure of PO intakes/consistency of adequate PO intakes PTA.   NUTRITION DIAGNOSIS:   Malnutrition (severe) related to chronic illness (advanced dementia) as evidenced by severe depletion of muscle mass, severe depletion of body fat.  GOAL:   Patient will meet greater than or equal to 90% of their needs  MONITOR:   Vent status, TF tolerance, Weight trends, Labs  REASON FOR ASSESSMENT:   Ventilator, Consult Enteral/tube feeding initiation and management  ASSESSMENT:   83 y/o F who presented to [facility] ER on 8/24 with reports of altered mental status.  At baseline, the patient lives with her daughter and has a history of advanced dementia and prior HTN (not on meds now).  She is typically carried to the bathroom by family.  The patient will also have periods where she will stay up for up to 3 days in a row.  She was up eating breakfast this am and told her daughter that she needed more coffee to wash her food down as she felt it was stuck.  Her daughter was on the phone and then noted that the patient slumped over. She heard the patient gurgling and EMS was activated.  Pt seen for new vent and TF consult. BMI indicates underweight status. Pt was intubated in the ED and OGT placed. Unable to talk with family at this time but will attempt at follow-up to obtain PTA information. RN obtained weight: 68 lbs (30.91 kg). No previous weight hx for comparison. Physical assessment shows moderate and  severe muscle and severe fat wasting throughout. No edema but abdomen distended.   Patient is currently intubated on ventilator support MV: 10 L/min Temp (24hrs), Avg:95.4 F (35.2 C), Min:95.4 F (35.2 C), Max:95.4 F (35.2 C) Propofol: none BP: 82/51 and MAP: 56  Medications reviewed; sliding scale Novolog, 40 mg IV Protonix/day. Labs reviewed; Na: 132 mmol/L, BUN: 29 mg/dL, ionized Ca: 4.09 mmol/L.   IVF: NS @ 50 mL/hr.    Diet Order:  Diet NPO time specified  Skin:  Reviewed, no issues  Last BM:  PTA/unknown  Height:   Ht Readings from Last 1 Encounters:  08/31/2016 5\' 5"  (1.651 m)    Weight:   Wt Readings from Last 1 Encounters:  No data found for Wt    Ideal Body Weight:  56.82 kg  BMI:  11.36 kg/m2  Estimated Nutritional Needs:   Kcal:  722  Protein:  37-46 grams (1.2-1.5 grams/kg)  Fluid:  >/= 1.2 L/day  EDUCATION NEEDS:   No education needs identified at this time    Trenton Gammon, MS, RD, LDN, CNSC Inpatient Clinical Dietitian Pager # [phone] After hours/weekend pager # [phone]

## 2016-08-25 NOTE — Progress Notes (Signed)
eLink Physician-Brief Progress Note Patient Name: Tarianna Bjelland DOB: 1931-07-13 MRN: [id number]   Date of Service  September 05, 2016  HPI/Events of Note  83 year old female unresponsive following eating breakfast. Status post intubation and thoracentesis for right pleural effusion. Pneumothorax ex vacuo. Contacted by bedside nurses patient now hypotensive on positive pressure ventilation.   eICU Interventions  1. Plan of care as previously stated by intensivist 2. Awaiting repeat portable chest x-ray 3. Awaiting response to IV fluid bolus      Intervention Category Evaluation Type: New Patient Evaluation  Lawanda Cousins 09-05-16, 5:25 PM

## 2016-08-25 NOTE — Progress Notes (Addendum)
eLink Physician-Brief Progress Note Patient Name: Tammie Gonzalez DOB: [birth year] MRN: [id number]   Date of Service  08/09/2016  HPI/Events of Note  Notified of hypotension again. Teresa Coombs shows no family at bedside. Nursing staff at bedside readjusting patient. Peak pressures still 32. Saturating 100%.   eICU Interventions  1. Repeating stat portable chest x-ray now over 2 hours after initial on positive pressure ventilation 2. Intensivist to assess patient at bedside and contact family for central line placement if aggressive care is to be pursued  3. Normal saline 500 mL bolus now      Intervention Category Major Interventions: Hypotension - evaluation and management  Lawanda Cousins 08/19/2016, 7:02 PM

## 2016-08-25 NOTE — Progress Notes (Signed)
Pharmacy Antibiotic Note  Tammie Gonzalez is a 83 y.o. female admitted on 09/17/2016 with pneumonia.  Pharmacy has been consulted for Vancomycin and Zosyn dosing.  SCr 0.7, CrCl ~ 29 ml/min Lactic acid 6.74 WBC 52.7  Plan:  Zosyn 3.375g IV Q8H infused over 4hrs.   Vancomycin 1g x1 dose then 500 mg IV q24h.  Measure Vanc trough at steady state.  Follow up renal fxn, culture results, and clinical course.    Height: 5\' 5"  (165.1 cm) Weight: (P) 68 lb 2 oz (30.9 kg) IBW/kg (Calculated) : 57  Temp (24hrs), Avg:95.4 F (35.2 C), Min:95.4 F (35.2 C), Max:95.4 F (35.2 C)   Recent Labs Lab 2016/09/17 1205 09/17/2016 1214 09/17/2016 1215  WBC 52.7*  --   --   CREATININE 0.75 0.70  --   LATICACIDVEN  --   --  6.74*    CrCl cannot be calculated (Unknown ideal weight.).    No Known Allergies  Antimicrobials this admission: 8/24 Vancomycin >>  8/24 Zosyn >>   Dose adjustments this admission:  Microbiology results: 8/24 BCx: sent 8/24 UCx: sent    Thank you for allowing pharmacy to be a part of this patient's care.  Lynann Beaver PharmD, BCPS Pager [phone] 09-17-2016 1:30 PM

## 2016-08-25 NOTE — ED Notes (Signed)
Report given to [facility] RN.

## 2016-08-26 ENCOUNTER — Inpatient Hospital Stay (HOSPITAL_COMMUNITY): Payer: Medicare Other

## 2016-08-26 DIAGNOSIS — J9601 Acute respiratory failure with hypoxia: Secondary | ICD-10-CM

## 2016-08-26 LAB — COMPREHENSIVE METABOLIC PANEL
ALBUMIN: 1.8 g/dL — AB (ref 3.5–5.0)
ALK PHOS: 64 U/L (ref 38–126)
ALT: 22 U/L (ref 14–54)
ANION GAP: 10 (ref 5–15)
AST: 37 U/L (ref 15–41)
BUN: 24 mg/dL — ABNORMAL HIGH (ref 6–20)
CALCIUM: 7.7 mg/dL — AB (ref 8.9–10.3)
CHLORIDE: 105 mmol/L (ref 101–111)
CO2: 21 mmol/L — AB (ref 22–32)
Creatinine, Ser: 0.56 mg/dL (ref 0.44–1.00)
GFR calc non Af Amer: >60 mL/min (ref >60)
GLUCOSE: 85 mg/dL (ref 65–99)
Potassium: 4.4 mmol/L (ref 3.5–5.1)
SODIUM: 136 mmol/L (ref 135–145)
Total Bilirubin: 1.7 mg/dL — ABNORMAL HIGH (ref 0.3–1.2)
Total Protein: 4.5 g/dL — ABNORMAL LOW (ref 6.5–8.1)

## 2016-08-26 LAB — GLUCOSE, CAPILLARY
GLUCOSE-CAPILLARY: 114 mg/dL — AB (ref 65–99)
GLUCOSE-CAPILLARY: 154 mg/dL — AB (ref 65–99)
GLUCOSE-CAPILLARY: 141 mg/dL — AB (ref 65–99)
Glucose-Capillary: 67 mg/dL (ref 65–99)
Glucose-Capillary: 85 mg/dL (ref 65–99)
Glucose-Capillary: 70 mg/dL (ref 65–99)
Glucose-Capillary: 96 mg/dL (ref 65–99)

## 2016-08-26 LAB — CBC
HEMATOCRIT: 32.7 % — AB (ref 36.0–46.0)
HEMOGLOBIN: 11.2 g/dL — AB (ref 12.0–15.0)
MCH: 31.1 pg (ref 26.0–34.0)
MCHC: 34.3 g/dL (ref 30.0–36.0)
MCV: 90.8 fL (ref 78.0–100.0)
Platelets: 46 10*3/uL — ABNORMAL LOW (ref 150–400)
RBC: 3.6 MIL/uL — AB (ref 3.87–5.11)
RDW: 13.5 % (ref 11.5–15.5)
WBC: 17.9 10*3/uL — ABNORMAL HIGH (ref 4.0–10.5)

## 2016-08-26 LAB — URINE CULTURE

## 2016-08-26 LAB — TROPONIN I

## 2016-08-26 LAB — PROCALCITONIN: PROCALCITONIN: 38.89 ng/mL

## 2016-08-26 LAB — MAGNESIUM
MAGNESIUM: 2.3 mg/dL (ref 1.7–2.4)
Magnesium: 2.2 mg/dL (ref 1.7–2.4)
Magnesium: 2 mg/dL (ref 1.7–2.4)

## 2016-08-26 LAB — PHOSPHORUS
PHOSPHORUS: 3.4 mg/dL (ref 2.5–4.6)
PHOSPHORUS: 2.9 mg/dL (ref 2.5–4.6)
Phosphorus: 3.5 mg/dL (ref 2.5–4.6)

## 2016-08-26 MED ORDER — AMIODARONE LOAD VIA INFUSION
150.0000 mg | Freq: Once | INTRAVENOUS | Status: AC
  Administered 2016-08-26: 150 mg via INTRAVENOUS
  Filled 2016-08-26: qty 83.34

## 2016-08-26 MED ORDER — PRO-STAT SUGAR FREE PO LIQD: 30.0000 mL | Freq: Two times a day (BID) | ORAL | Status: DC

## 2016-08-26 MED ORDER — CHLORHEXIDINE GLUCONATE 0.12 % MT SOLN
OROMUCOSAL | Status: AC
  Filled 2016-08-26: qty 15

## 2016-08-26 MED ORDER — AMIODARONE HCL IN DEXTROSE 360-4.14 MG/200ML-% IV SOLN
60.0000 mg/h | INTRAVENOUS | Status: DC
  Administered 2016-08-26: 60 mg/h via INTRAVENOUS

## 2016-08-26 MED ORDER — AMIODARONE HCL IN DEXTROSE 360-4.14 MG/200ML-% IV SOLN
30.0000 mg/h | INTRAVENOUS | Status: DC
  Administered 2016-08-26 – 2016-08-28 (×4): 30 mg/h via INTRAVENOUS
  Filled 2016-08-26 (×5): qty 200

## 2016-08-26 MED ORDER — ADENOSINE 6 MG/2ML IV SOLN
INTRAVENOUS | Status: AC
  Filled 2016-08-26: qty 2

## 2016-08-26 MED ORDER — SODIUM CHLORIDE 0.9 % IV BOLUS (SEPSIS)
250.0000 mL | Freq: Once | INTRAVENOUS | Status: AC
  Administered 2016-08-26: 250 mL via INTRAVENOUS

## 2016-08-26 MED ORDER — AMIODARONE HCL IN DEXTROSE 360-4.14 MG/200ML-% IV SOLN
INTRAVENOUS | Status: AC
  Filled 2016-08-26: qty 200

## 2016-08-26 MED ORDER — SODIUM CHLORIDE 0.9 % IV BOLUS (SEPSIS)
750.0000 mL | Freq: Once | INTRAVENOUS | Status: AC
  Administered 2016-08-26: 750 mL via INTRAVENOUS

## 2016-08-26 MED ORDER — VITAL HIGH PROTEIN PO LIQD
1000.0000 mL | ORAL | Status: DC
  Filled 2016-08-26: qty 1000

## 2016-08-26 MED ORDER — AMIODARONE IV BOLUS ONLY 150 MG/100ML
INTRAVENOUS | Status: AC
  Administered 2016-08-26: 150 mg
  Filled 2016-08-26: qty 100

## 2016-08-26 MED ORDER — VITAL AF 1.2 CAL PO LIQD
1000.0000 mL | ORAL | Status: DC
  Administered 2016-08-26: 1000 mL
  Filled 2016-08-26: qty 1000

## 2016-08-26 MED ORDER — SODIUM CHLORIDE 0.9 % IV BOLUS (SEPSIS)
500.0000 mL | Freq: Once | INTRAVENOUS | Status: AC
  Administered 2016-08-26: 500 mL via INTRAVENOUS

## 2016-08-26 MED ORDER — PANTOPRAZOLE SODIUM 40 MG PO PACK
40.0000 mg | PACK | ORAL | Status: DC
  Administered 2016-08-26 – 2016-08-31 (×6): 40 mg
  Filled 2016-08-26 (×6): qty 20

## 2016-08-26 MED ORDER — MIDAZOLAM HCL 2 MG/2ML IJ SOLN
INTRAMUSCULAR | Status: AC
  Filled 2016-08-26: qty 2

## 2016-08-26 NOTE — Progress Notes (Signed)
Notified Dr. Craige Cotta of patient continues to have elevated HR in 140-150's.  Received a bolus of IVF of NS 750cc over one hour.  No new orders.  Continue to monitor patient closely.  Taha Dimond Debroah Loop RN

## 2016-08-26 NOTE — Progress Notes (Signed)
eLink Physician-Brief Progress Note Patient Name: Tammie Gonzalez DOB: 1931/01/29 MRN: [id number]   Date of Service  08/26/2016  HPI/Events of Note  Hypotension - BP = 78/44. Patient is DNR with no escalation of care.   eICU Interventions  Will order: 1. Bolus with 0.9 NaCl 250 mL IV over 30 minutes now.      Intervention Category Major Interventions: Hypotension - evaluation and management  Ardis Lawley Dennard Nip 08/26/2016, 8:13 PM

## 2016-08-26 NOTE — Progress Notes (Signed)
Spokes with pt's daughter over the phone.  Family would like to continue current therapies with the hope she can get better.  They understand that she is maximal appropriate therapy, and that if current therapies don't work then there is nothing further than can be offered.  If her medical continues to deteriorate to the point of cardiac arrest then the family would not want cardiac resuscitation.  DNR order entered.   Coralyn Helling, MD [facility] Pulmonary/Critical Care 08/26/2016, 3:18 PM Pager:  [phone] After 3pm call: [phone]

## 2016-08-26 NOTE — Progress Notes (Signed)
Patient decreased her HR to the 90's for several hours, and then increased her HR 130-140's.  The slower rate is NSR, the faster rate is A fib.  Cont to monitor the patient closely.  Garrison Michie Debroah Loop RN

## 2016-08-26 NOTE — Progress Notes (Signed)
Pt weaned 20/5 and 40% all vitals maintained except her HR. HR increased to 158. RT placed Pt on full support and the Pt's Hr decreased to 102-110. RT will continue to monitor

## 2016-08-26 NOTE — Progress Notes (Signed)
Pts BP 68/39.  Dr Arsenio Loader notifide.  Orders received

## 2016-08-26 NOTE — Progress Notes (Signed)
Consulted for TF  Restarted Tube feeding, Vital AF @ 25 ml/hr. RD on site to f/u and re-evaluate tomorrow.   Christophe Louis RD, LDN, CNSC Clinical Nutrition Pager: [phone] 08/26/2016 9:18 AM

## 2016-08-26 NOTE — Progress Notes (Addendum)
PULMONARY / CRITICAL CARE MEDICINE   Name: Tammie Gonzalez MRN: [id number] DOB: Feb 14, 1931    ADMISSION DATE:  08/14/2016 CONSULTATION DATE:  08/14/2016  REFERRING MD:  Dr. Dalene Seltzer / EDP   CHIEF COMPLAINT:  AMS  HISTORY OF PRESENT ILLNESS:   83 yo female brought to ER with altered mental status, hypotension, hypothermia, lactic acidosis, respiratory failure with compromised airway from pneumonia with concern for aspiration, empyema, and septic shock.  Course complicated by A fib with RVR.  SUBJECTIVE:  Started on Amiodarone.  Borderline hypotension.  VITAL SIGNS: BP (!) 70/50   Pulse (!) 176   Temp (!) 100.8 F (38.2 C)   Resp (!) 30   Ht 5\' 5"  (1.651 m)   Wt 74 lb 15.3 oz (34 kg)   SpO2 97%   BMI 12.47 kg/m   VENTILATOR SETTINGS: Vent Mode: PCV FiO2 (%):  [40 %-70 %] 40 % Set Rate:  [18 bmp-30 bmp] 30 bmp Vt Set:  [420 mL-460 mL] 460 mL PEEP:  [5 cmH20] 5 cmH20 Plateau Pressure:  [20 cmH20-32 cmH20] 22 cmH20  INTAKE / OUTPUT: I/O last 3 completed shifts: In: 5827.5 [I.V.:1087.5; NG/GT:90; IV Piggyback:4650] Out: 965 [Urine:735; Emesis/NG output:230]  PHYSICAL EXAMINATION:  General - ill appearing, cachectic Eyes - pupils reactive ENT - ETT in place Cardiac - regular, tachycardic Chest - b/l crackles Abd - soft, non tender Ext - decreased muscle bulk Skin -  Neuro - normal strength Psych - normal mood   LABS:  BMET  Recent Labs Lab 08/27/2016 1205 08/23/2016 1214 08/26/16 0533  NA 134* 132* 136  K 4.2 4.4 4.4  CL 102 102 105  CO2 22  --  21*  BUN 24* 29* 24*  CREATININE 0.75 0.70 0.56  GLUCOSE 274* 269* 85    Electrolytes  Recent Labs Lab 08/23/2016 1205 08/20/2016 1641 08/08/2016 1840 08/26/16 0533  CALCIUM 8.4*  --   --  7.7*  MG 2.8* 2.8* 2.7* 2.3  PHOS  --  4.2 3.3 3.4    CBC  Recent Labs Lab 08/10/2016 1205 08/24/2016 1214 08/26/16 0533  WBC 52.7*  --  17.9*  HGB 11.5* 11.9* 11.2*  HCT 36.0 35.0* 32.7*  PLT 130*  --  46*     Coag's No results for input(s): APTT, INR in the last 168 hours.  Sepsis Markers  Recent Labs Lab 08/08/2016 1215 08/13/2016 1319 08/22/2016 1840 08/26/16 0533  LATICACIDVEN 6.74*  --  3.8*  --   PROCALCITON  --  22.68  --  38.89    ABG  Recent Labs Lab 08/26/2016 1210 08/16/2016 2035  PHART 7.100* 7.339*  PCO2ART 67.4* 42.2  PO2ART 252* 70.9*    Liver Enzymes  Recent Labs Lab 08/21/2016 1205 08/26/16 0533  AST 43* 37  ALT 25 22  ALKPHOS 93 64  BILITOT 1.3* 1.7*  ALBUMIN 2.1* 1.8*    Cardiac Enzymes  Recent Labs Lab 08/04/2016 1641 08/03/2016 1840 08/26/16 0107  TROPONINI <0.03 0.03* <0.03    Glucose  Recent Labs Lab 08/26/2016 1750 08/18/2016 2102 08/29/2016 2311 08/26/16 0312 08/26/16 0314 08/26/16 0739  GLUCAP 119* 75 86 67 85 70    Imaging Ct Head Wo Contrast  Result Date: 08/22/2016 CLINICAL DATA:  Altered level of consciousness EXAM: CT HEAD WITHOUT CONTRAST TECHNIQUE: Contiguous axial images were obtained from the base of the skull through the vertex without intravenous contrast. COMPARISON:  None. FINDINGS: Brain: There is mild to moderate diffuse atrophy. There is no intracranial mass, hemorrhage, extra-axial  fluid collection, or midline shift. There is small vessel disease in the centra semiovale bilaterally. There is evidence of a prior infarct at the gray - white junction of the right occipital lobe near the atrium the right lateral ventricle. There is a prior infarct involving a portion of the anterior limb of the left internal capsule in the adjacent anterior lentiform nucleus. No acute infarct is appreciable. Vascular: There is no hyperdense vessel. There is calcification in each carotid siphon region. Skull: The bony calvarium appears intact. Sinuses/Orbits: There is mucosal thickening in several ethmoid air cells bilaterally. Paranasal sinuses elsewhere are clear. There is rightward deviation of the nasal septum. Orbits appear symmetric bilaterally.  Other: Mastoid air cells are clear. IMPRESSION: Atrophy with periventricular small vessel disease bilaterally. Prior infarct at the gray- white compartment junction of the midportion of the upper right occipital lobe. Prior infarct involving a portion of the left anterior lentiform nucleus and adjacent anterior limb left internal capsule. No acute infarct evident. No intracranial mass, hemorrhage, or extra-axial fluid collection. There are foci of arterial vascular calcification. There is mucosal thickening in several ethmoid air cells. There is rightward deviation of the nasal septum. Electronically Signed   By: Bretta Bang III M.D.   On: Sep 01, 2016 12:05   Dg Chest Port 1 View  Result Date: 08/26/2016 CLINICAL DATA:  Respiratory failure. EXAM: PORTABLE CHEST 1 VIEW COMPARISON:  Radiographs yesterday. FINDINGS: Endotracheal tube 4.2 cm from the carina. Enteric tube in place, tip below the diaphragm not included in the field of view. Small right basilar pneumothorax is unchanged from prior exam. No mediastinal shift. Unchanged heart size and mediastinal contours. Patchy opacities throughout the right lung have worsened. Left perihilar patchy opacities are unchanged. The lungs are hyperinflated. The bones are under mineralized. Deformity of the left proximal humerus is suboptimally assessed. IMPRESSION: 1. Unchanged small right basilar pneumothorax from prior exam. 2. Progressive patchy opacities throughout the right lung, suspicious for pneumonia, less likely pulmonary edema. Patchy opacity throughout the left perihilar lung is unchanged. 3. Endotracheal and enteric tubes remain in place. Electronically Signed   By: Rubye Oaks M.D.   On: 08/26/2016 05:48   Dg Chest Port 1 View  Result Date: 01-Sep-2016 CLINICAL DATA:  Hypertension. EXAM: PORTABLE CHEST 1 VIEW COMPARISON:  Earlier today. FINDINGS: An approximately 20-25% right basilar pneumothorax has not changed significantly. No mediastinal  shift. Mildly progressive patchy airspace opacity in the right upper lung zone without significant change in patchy airspace opacity elsewhere in both lungs. Normal sized heart. Endotracheal tube in satisfactory position. Nasogastric tube tip and side hole in the stomach. Mild scoliosis. Diffuse osteopenia. Stable left humeral neck fracture. IMPRESSION: 1. No significant change in a 20-25% right basilar pneumothorax. 2. Mildly progressive patchy airspace opacity in the right upper lung zone compatible with pneumonia. 3. No significant change in patchy airspace opacity elsewhere in both lungs concerning for pneumonia. 4. Stable left humeral neck fracture. Electronically Signed   By: Beckie Salts M.D.   On: September 01, 2016 19:27   Dg Chest Port 1 View  Result Date: September 01, 2016 CLINICAL DATA:  Patient with history of right pleural effusion status post removal. EXAM: PORTABLE CHEST 1 VIEW COMPARISON:  Chest radiograph September 01, 2016 FINDINGS: ET tube terminates in the mid to distal trachea. Enteric tube courses inferior to the diaphragm. Re- demonstrated diffuse bilateral heterogeneous pulmonary opacities. Persistent small left pleural effusion. Small right pleural effusion. Grossly unchanged small to moderate right basilar pneumothorax. Thoracic spine degenerative changes. Unchanged proximal  left humerus fracture. IMPRESSION: Persistent small to moderate right basilar pneumothorax. Grossly unchanged diffuse bilateral heterogeneous pulmonary opacities which may represent edema or infection. Electronically Signed   By: Annia Belt M.D.   On: 09/19/16 16:50   Dg Chest Port 1 View  Result Date: 09/19/2016 CLINICAL DATA:  Status post thoracentesis. EXAM: PORTABLE CHEST 1 VIEW COMPARISON:  Chest x-ray from same date. FINDINGS: Endotracheal tube in place with the tip approximately 3.1 cm above the carina. NG tube with tip and distal side port in the stomach. Interval decrease in size of now small right pleural effusion. Small  right basal predominant pneumothorax. Trace left pleural effusion is unchanged. Bilateral interstitial predominant pulmonary infiltrates are again noted. Normal heart size. Unchanged nonunited left proximal humerus fracture. IMPRESSION: 1. Interval decrease in size of right pleural effusion, now with small basal pneumothorax, possibly ex vacuo after thoracentesis. 2. Bilateral interstitial predominant pulmonary infiltrates, which may represent edema or infection. Electronically Signed   By: Obie Dredge M.D.   On: 09/19/16 16:12   Dg Chest Portable 1 View  Result Date: 09/19/16 CLINICAL DATA:  Endotracheal tube placement. EXAM: PORTABLE CHEST 1 VIEW COMPARISON:  None. FINDINGS: Endotracheal tube is in good position. NG tube tip is below the diaphragm. Large right pleural effusion. Heart size is normal. Pulmonary vascularity appears slightly prominent. 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 IMPRESSION: 1. Endotracheal tube in good position. 2. Large right pleural effusion. 3. Pulmonary vascular congestion. Electronically Signed   By: Francene Boyers M.D.   On: 09/19/2016 11:37   Dg Abd Portable 1v  Result Date: 2016-09-19 CLINICAL DATA:  OG tube placement. EXAM: PORTABLE ABDOMEN - 1 VIEW COMPARISON:  None. FINDINGS: OG tube tip is in the body of the stomach. There is no appreciable free air in the abdomen. Numerous gallstones. Large right pleural effusion. Pulmonary vascular congestion. IMPRESSION: OG tube tip in the body of the stomach. No appreciable free air in the abdomen. Cholelithiasis. Large right pleural effusion. Pulmonary vascular congestion. Electronically Signed   By: Francene Boyers M.D.   On: 2016-09-19 11:39     STUDIES:  CT Head 8/24 >> atrophy with periventricular small vessel disease bilaterally, prior infarct at the gray-white compartment junction of the midportion of the upper right occipital lobe, prior infarct involving a portion of the left anterior lentiform  nucleus and adjacent anterior limb left internal capsule.  No evidence of acute infarct, mass, hemorrhage or fluid collection.   Rt thoracentesis 8/24 >> 1.1 liters, glucose 30, protein 4, LDH 1028, WBC 8531 (92% N)  CULTURES: BCx2 8/24 >>  UC 8/24 >>  Sputum 8/24 >> RPR 8/24 >> Rt pleural fluid 8/24 >>    ANTIBIOTICS: Vancomycin (empiric) 8/24 >>  Zosyn (empiric) 8/24 >>   SIGNIFICANT EVENTS: 8/24 Admit with AMS, intubated  8/25 A fib with RVR >> started amiodarone   LINES/TUBES: ETT 8/24 >>   DISCUSSION: 83 y/o female with VDRF, sepsis 2nd to aspiration PNA and empyema.  ASSESSMENT / PLAN:  PULMONARY A: Acute hypoxic/hypercapnic respiratory failure from PNA (aspiration) and rt empyema. P: Full vent support F/u CXR  CARDIOVASCULAR A:  Sepsis 2nd to pneumonia and empyema. A fib with RVR. P:  Continue IV fluids Amiodarone started 8/25  RENAL A:   Metabolic acidosis with lactic acidosis. P:   F/u BMET, lactic acid   GASTROINTESTINAL A:   Severe protein calorie malnutrition. P:   Tube feeds  HEMATOLOGIC A:   Anemia of critical illness.  Thrombocytopenia most likely from sepsis. P:  F/u CBC SCDs for DVT prophylaxis  INFECTIOUS A:   Sepsis from aspiration pneumonia with rt empyema. P:   Day 2 of Abx F/u RPR  ENDOCRINE A:   Hyperglycemia. P:   CBG Q4 with SSI   NEUROLOGIC A:   Acute metabolic encephalopathy 2nd to sepsis. Hx of dementia.  P:   RASS goal 0 to -1  CC time 34 minutes  Coralyn Helling, MD [facility] Pulmonary/Critical Care 08/26/2016, 8:51 AM Pager:  [phone] After 3pm call: [phone]

## 2016-08-26 NOTE — Progress Notes (Signed)
Pt has soft b/p e-link, md informed.  New orders received and initiated.

## 2016-08-27 ENCOUNTER — Inpatient Hospital Stay (HOSPITAL_COMMUNITY): Payer: Medicare Other

## 2016-08-27 DIAGNOSIS — R0600 Dyspnea, unspecified: Secondary | ICD-10-CM

## 2016-08-27 LAB — GLUCOSE, CAPILLARY
GLUCOSE-CAPILLARY: 142 mg/dL — AB (ref 65–99)
GLUCOSE-CAPILLARY: 145 mg/dL — AB (ref 65–99)
GLUCOSE-CAPILLARY: 131 mg/dL — AB (ref 65–99)
Glucose-Capillary: 157 mg/dL — ABNORMAL HIGH (ref 65–99)
Glucose-Capillary: 136 mg/dL — ABNORMAL HIGH (ref 65–99)
Glucose-Capillary: 116 mg/dL — ABNORMAL HIGH (ref 65–99)

## 2016-08-27 LAB — ECHOCARDIOGRAM COMPLETE
HEIGHTINCHES: 65 in
WEIGHTICAEL: 1326.29 [oz_av]

## 2016-08-27 LAB — PH, BODY FLUID: PH, BODY FLUID: 6.5

## 2016-08-27 LAB — BASIC METABOLIC PANEL
Anion gap: 5 (ref 5–15)
BUN: 23 mg/dL — AB (ref 6–20)
CHLORIDE: 109 mmol/L (ref 101–111)
CO2: 25 mmol/L (ref 22–32)
CREATININE: 0.46 mg/dL (ref 0.44–1.00)
Calcium: 7.9 mg/dL — ABNORMAL LOW (ref 8.9–10.3)
GFR calc Af Amer: >60 mL/min (ref >60)
GFR calc non Af Amer: >60 mL/min (ref >60)
GLUCOSE: 152 mg/dL — AB (ref 65–99)
POTASSIUM: 2.8 mmol/L — AB (ref 3.5–5.1)
Sodium: 139 mmol/L (ref 135–145)

## 2016-08-27 LAB — PHOSPHORUS
PHOSPHORUS: 2.7 mg/dL (ref 2.5–4.6)
Phosphorus: 1.7 mg/dL — ABNORMAL LOW (ref 2.5–4.6)

## 2016-08-27 LAB — CBC
HEMATOCRIT: 26 % — AB (ref 36.0–46.0)
Hemoglobin: 8.7 g/dL — ABNORMAL LOW (ref 12.0–15.0)
MCH: 30.4 pg (ref 26.0–34.0)
MCHC: 33.5 g/dL (ref 30.0–36.0)
MCV: 90.9 fL (ref 78.0–100.0)
PLATELETS: 21 10*3/uL — AB (ref 150–400)
RBC: 2.86 MIL/uL — ABNORMAL LOW (ref 3.87–5.11)
RDW: 13.8 % (ref 11.5–15.5)
WBC: 12.9 10*3/uL — ABNORMAL HIGH (ref 4.0–10.5)

## 2016-08-27 LAB — MAGNESIUM
Magnesium: 2 mg/dL (ref 1.7–2.4)
Magnesium: 1.9 mg/dL (ref 1.7–2.4)

## 2016-08-27 LAB — RPR: RPR Ser Ql: NONREACTIVE

## 2016-08-27 LAB — PROCALCITONIN: PROCALCITONIN: 22.56 ng/mL

## 2016-08-27 MED ORDER — SODIUM CHLORIDE 0.9 % IV BOLUS (SEPSIS)
250.0000 mL | Freq: Once | INTRAVENOUS | Status: AC
  Administered 2016-08-27: 250 mL via INTRAVENOUS

## 2016-08-27 MED ORDER — POTASSIUM PHOSPHATES 15 MMOLE/5ML IV SOLN
30.0000 mmol | Freq: Once | INTRAVENOUS | Status: AC
  Administered 2016-08-27: 30 mmol via INTRAVENOUS
  Filled 2016-08-27: qty 10

## 2016-08-27 MED ORDER — VITAL 1.5 CAL PO LIQD
1000.0000 mL | ORAL | Status: DC
  Administered 2016-08-27: 1000 mL
  Filled 2016-08-27 (×2): qty 1000

## 2016-08-27 MED ORDER — POTASSIUM CHLORIDE 20 MEQ/15ML (10%) PO SOLN
40.0000 meq | ORAL | Status: AC
  Administered 2016-08-27 (×2): 40 meq
  Filled 2016-08-27 (×2): qty 30

## 2016-08-27 NOTE — Progress Notes (Signed)
Nutrition Follow-up  DOCUMENTATION CODES:   Severe malnutrition in context of chronic illness, Underweight  INTERVENTION:   Monitor magnesium, potassium, and phosphorus daily for at least 3 days, MD to replete as needed, as pt is at risk for refeeding syndrome given severe malnutrition, unsure of PO intakes/consistency of adequate PO intakes PTA.  Switch formula to Vital 1.5, will advance to 30 ml/hr. Which is goal rate. This provides 1080 kcal (89% of needs), 48g protein and 550 ml H2O.  Recommend supplementation of 100 mg Thiamine daily for at least 3 days given refeeding risk and severe malnutrition.   NUTRITION DIAGNOSIS:   Malnutrition (severe) related to chronic illness (advanced dementia) as evidenced by severe depletion of muscle mass, severe depletion of body fat.  Ongoing.  GOAL:   Patient will meet greater than or equal to 90% of their needs  Progressing.  MONITOR:   Vent status, TF tolerance, Weight trends, Labs  REASON FOR ASSESSMENT:   Ventilator, Consult Enteral/tube feeding initiation and management  ASSESSMENT:   83 y/o F who presented to [facility] ER on 8/24 with reports of altered mental status.  At baseline, the patient lives with her daughter and has a history of advanced dementia and prior HTN (not on meds now).  She is typically carried to the bathroom by family.  The patient will also have periods where she will stay up for up to 3 days in a row.  She was up eating breakfast this am and told her daughter that she needed more coffee to wash her food down as she felt it was stuck.  Her daughter was on the phone and then noted that the patient slumped over. She heard the patient gurgling and EMS was activated.  Patient's weight has increased to 82 lb, +14 lb since 8/24. Used admission weight (68 lb) for estimated needs below. To better meet needs, will adjust formula and goal rate. Pt is receiving Vital AF 1.2 @ 25 ml/hr currently. Pt is showing signs of  refeeding syndrome given low K and Phos levels. Repletion per MD. Recommend thiamine supplementation as well.   Patient is currently intubated on ventilator support MV: 10.1 L/min Temp (24hrs), Avg:99.8 F (37.7 C), Min:98.8 F (37.1 C), Max:100.6 F (38.1 C)  Medications: Protonix suspension daily, Lactated Ringers infusion at 75 ml/hr, K-PHOS infusion once Labs reviewed: CBGs: 142-157 Low K, Phos Mg WNL  Diet Order:  Diet NPO time specified  Skin:  Reviewed, no issues  Last BM:  8/26  Height:   Ht Readings from Last 1 Encounters:  08/20/2016 5\' 5"  (1.651 m)    Weight:   Wt Readings from Last 1 Encounters:  08/27/16 82 lb 14.3 oz (37.6 kg)    Ideal Body Weight:  56.82 kg  BMI:  Body mass index is 13.79 kg/m.  Estimated Nutritional Needs:   Kcal:  1208  Protein:  40-50g  Fluid:  >/= 1.2 L/day  EDUCATION NEEDS:   No education needs identified at this time  Tilda Franco, MS, RD, LDN Pager: [phone] After Hours Pager: [phone]

## 2016-08-27 NOTE — Progress Notes (Signed)
Dr. Darrick Penna notified of pts BP and improved heart rate with last NS fluid bolus.  Additional bolus given.

## 2016-08-27 NOTE — Progress Notes (Signed)
Dr. Darrick Penna notified of critical platelet level 21,000.  Also aware of potassium 2.8 and other labs resulted.

## 2016-08-27 NOTE — Progress Notes (Signed)
PULMONARY / CRITICAL CARE MEDICINE   Name: Tammie Gonzalez MRN: [id number] DOB: 03-Apr-1931    ADMISSION DATE:  08/10/2016 CONSULTATION DATE:  08/29/2016  REFERRING MD:  Dr. Dalene Seltzer / EDP   CHIEF COMPLAINT:  AMS  HISTORY OF PRESENT ILLNESS:   83 yo female brought to ER with altered mental status, hypotension, hypothermia, lactic acidosis, respiratory failure with compromised airway from pneumonia with concern for aspiration, empyema, and septic shock.  Course complicated by A fib with RVR.  SUBJECTIVE:  HR better.  Getting electrolyte replacements.  VITAL SIGNS: BP (!) 97/32   Pulse 76   Temp 98.8 F (37.1 C)   Resp (!) 30   Ht 5\' 5"  (1.651 m)   Wt 82 lb 14.3 oz (37.6 kg)   SpO2 100%   BMI 13.79 kg/m   VENTILATOR SETTINGS: Vent Mode: PCV FiO2 (%):  [30 %-40 %] 30 % Set Rate:  [30 bmp] 30 bmp PEEP:  [5 cmH20] 5 cmH20 Plateau Pressure:  [19 cmH20-22 cmH20] 21 cmH20  INTAKE / OUTPUT: I/O last 3 completed shifts: In: 7100.4 [I.V.:2995.4; NG/GT:635; IV Piggyback:3470] Out: 1700 [Urine:1470; Emesis/NG output:230]  PHYSICAL EXAMINATION:  General - somnolent ENT - ETT in place Cardiac - regular, no murmur Chest - better air movement, scattered rhonchi Abd - soft, non tender Ext - no edema Skin - no rashes Neuro - moves extremities   LABS:  BMET  Recent Labs Lab 08/22/2016 1205 08/12/2016 1214 08/26/16 0533 08/27/16 0500  NA 134* 132* 136 139  K 4.2 4.4 4.4 2.8*  CL 102 102 105 109  CO2 22  --  21* 25  BUN 24* 29* 24* 23*  CREATININE 0.75 0.70 0.56 0.46  GLUCOSE 274* 269* 85 152*    Electrolytes  Recent Labs Lab 08/29/2016 1205  08/26/16 0533 08/26/16 1640 08/27/16 0500  CALCIUM 8.4*  --  7.7*  --  7.9*  MG 2.8*  < > 2.2  2.3 2.0 2.0  PHOS  --   < > 3.5  3.4 2.9 1.7*  < > = values in this interval not displayed.  CBC  Recent Labs Lab 08/16/2016 1205 08/27/2016 1214 08/26/16 0533 08/27/16 0500  WBC 52.7*  --  17.9* 12.9*  HGB 11.5* 11.9* 11.2*  8.7*  HCT 36.0 35.0* 32.7* 26.0*  PLT 130*  --  46* 21*    Coag's No results for input(s): APTT, INR in the last 168 hours.  Sepsis Markers  Recent Labs Lab 08/21/2016 1215 08/24/2016 1319 08/11/2016 1840 08/26/16 0533 08/27/16 0500  LATICACIDVEN 6.74*  --  3.8*  --   --   PROCALCITON  --  22.68  --  38.89 22.56    ABG  Recent Labs Lab 08/09/2016 1210 08/19/2016 2035  PHART 7.100* 7.339*  PCO2ART 67.4* 42.2  PO2ART 252* 70.9*    Liver Enzymes  Recent Labs Lab 08/18/2016 1205 08/26/16 0533  AST 43* 37  ALT 25 22  ALKPHOS 93 64  BILITOT 1.3* 1.7*  ALBUMIN 2.1* 1.8*    Cardiac Enzymes  Recent Labs Lab 08/27/2016 1641 08/31/2016 1840 08/26/16 0107  TROPONINI <0.03 0.03* <0.03    Glucose  Recent Labs Lab 08/26/16 1101 08/26/16 1531 08/26/16 1919 08/26/16 2303 08/27/16 0302 08/27/16 0816  GLUCAP 96 114* 154* 141* 142* 157*    Imaging Dg Chest Port 1 View  Result Date: 08/27/2016 CLINICAL DATA:  Respiratory failure.  Nonsmoker. EXAM: PORTABLE CHEST 1 VIEW COMPARISON:  08/26/2016. FINDINGS: Support tubes and lines unchanged. Cardiac silhouette stable.  BILATERAL pulmonary opacities remain worse on the RIGHT. Small RIGHT basilar pneumothorax redemonstrated. IMPRESSION: Stable chest.  Small RIGHT basilar pneumothorax redemonstrated. Electronically Signed   By: Elsie Stain M.D.   On: 08/27/2016 07:08     STUDIES:  CT Head 8/24 >> atrophy with periventricular small vessel disease bilaterally, prior infarct at the gray-white compartment junction of the midportion of the upper right occipital lobe, prior infarct involving a portion of the left anterior lentiform nucleus and adjacent anterior limb left internal capsule.  No evidence of acute infarct, mass, hemorrhage or fluid collection.   Rt thoracentesis 8/24 >> 1.1 liters, glucose 30, protein 4, LDH 1028, WBC 8531 (92% N)  CULTURES: BCx2 8/24 >>  UC 8/24 >>  Sputum 8/24 >> RPR 8/24 >> Rt pleural fluid 8/24 >>     ANTIBIOTICS: Vancomycin (empiric) 8/24 >> 8/26 Zosyn (empiric) 8/24 >>   SIGNIFICANT EVENTS: 8/24 Admit with AMS, intubated  8/25 A fib with RVR >> started amiodarone; family meeting >> continue current medical care, but no escalation and DNR  LINES/TUBES: ETT 8/24 >>   DISCUSSION: 83 y/o female with VDRF, sepsis 2nd to aspiration PNA and empyema.  ASSESSMENT / PLAN:  PULMONARY A: Acute hypoxic/hypercapnic respiratory failure from PNA (aspiration) and rt empyema. P: Full vent support >> using pressure control F/u CXR  CARDIOVASCULAR A:  Sepsis 2nd to pneumonia and empyema. A fib with RVR. P:  Continue IV fluids, amiodarone  RENAL A:   Metabolic acidosis with lactic acidosis >> resolved. Hypokalemia, hypophosphatemia, hypomagnesemia. P:   F/u electrolytes  GASTROINTESTINAL A:   Severe protein calorie malnutrition. P:   Tube feeds  HEMATOLOGIC A:   Anemia of critical illness. Thrombocytopenia most likely from sepsis. P:  F/u CBC SCDs for DVT prophylaxis  INFECTIOUS A:   Sepsis from aspiration pneumonia with rt empyema. P:   Day 3 of Abx D/c vancomycin 8/26 F/u RPR from 8/25  ENDOCRINE A:   Hyperglycemia. P:   SSI  NEUROLOGIC A:   Acute metabolic encephalopathy 2nd to sepsis. Hx of dementia.  P:   RASS goal 0 to 01  CC time 31 minutes  Coralyn Helling, MD [facility] Pulmonary/Critical Care 08/27/2016, 9:52 AM Pager:  [phone] After 3pm call: [phone]

## 2016-08-27 NOTE — Progress Notes (Signed)
eLink Physician-Brief Progress Note Patient Name: Tammie Gonzalez DOB: Oct 31, 1931 MRN: [id number]   Date of Service  08/27/2016  HPI/Events of Note  Hypokalemia and hypophos  eICU Interventions  Potassium and phos replaced     Intervention Category Intermediate Interventions: Electrolyte abnormality - evaluation and management  [name] 08/27/2016, 6:18 AM

## 2016-08-27 NOTE — Progress Notes (Signed)
ET tube holder was changed at 1145. The Pt had breakdown on the right and left side of her face. RT placed tecoderm on both side of the Pt's face and secured the ET Tube at 22 at the lip. RT placed the velcro up higher on the Pt's head, due to the ET tube holder sliding off her face. RT will continue to monitor.

## 2016-08-27 NOTE — Progress Notes (Signed)
  Echocardiogram 2D Echocardiogram has been performed.  Nolon Rod 08/27/2016, 2:27 PM

## 2016-08-28 ENCOUNTER — Inpatient Hospital Stay (HOSPITAL_COMMUNITY): Payer: Medicare Other

## 2016-08-28 DIAGNOSIS — J9600 Acute respiratory failure, unspecified whether with hypoxia or hypercapnia: Secondary | ICD-10-CM

## 2016-08-28 LAB — BODY FLUID CULTURE: Culture: NO GROWTH

## 2016-08-28 LAB — CBC
HEMATOCRIT: 24.2 % — AB (ref 36.0–46.0)
HEMOGLOBIN: 8 g/dL — AB (ref 12.0–15.0)
MCH: 30.3 pg (ref 26.0–34.0)
MCHC: 33.1 g/dL (ref 30.0–36.0)
MCV: 91.7 fL (ref 78.0–100.0)
Platelets: 13 10*3/uL — CL (ref 150–400)
RBC: 2.64 MIL/uL — ABNORMAL LOW (ref 3.87–5.11)
RDW: 14 % (ref 11.5–15.5)
WBC: 13.3 10*3/uL — AB (ref 4.0–10.5)

## 2016-08-28 LAB — GLUCOSE, CAPILLARY
GLUCOSE-CAPILLARY: 112 mg/dL — AB (ref 65–99)
GLUCOSE-CAPILLARY: 114 mg/dL — AB (ref 65–99)
GLUCOSE-CAPILLARY: 152 mg/dL — AB (ref 65–99)
GLUCOSE-CAPILLARY: 130 mg/dL — AB (ref 65–99)
Glucose-Capillary: 123 mg/dL — ABNORMAL HIGH (ref 65–99)
Glucose-Capillary: 125 mg/dL — ABNORMAL HIGH (ref 65–99)

## 2016-08-28 LAB — BASIC METABOLIC PANEL
ANION GAP: 6 (ref 5–15)
BUN: 12 mg/dL (ref 6–20)
CHLORIDE: 104 mmol/L (ref 101–111)
CO2: 27 mmol/L (ref 22–32)
Calcium: 7.7 mg/dL — ABNORMAL LOW (ref 8.9–10.3)
Creatinine, Ser: 0.38 mg/dL — ABNORMAL LOW (ref 0.44–1.00)
GFR calc Af Amer: >60 mL/min (ref >60)
Glucose, Bld: 293 mg/dL — ABNORMAL HIGH (ref 65–99)
POTASSIUM: 3.8 mmol/L (ref 3.5–5.1)
SODIUM: 137 mmol/L (ref 135–145)

## 2016-08-28 MED ORDER — OSMOLITE 1.5 CAL PO LIQD
1000.0000 mL | ORAL | Status: DC
  Administered 2016-08-28 – 2016-08-31 (×4): 1000 mL
  Filled 2016-08-28 (×6): qty 1000

## 2016-08-28 MED ORDER — POTASSIUM CHLORIDE 20 MEQ/15ML (10%) PO SOLN
40.0000 meq | Freq: Once | ORAL | Status: AC
  Administered 2016-08-28: 40 meq
  Filled 2016-08-28: qty 30

## 2016-08-28 MED ORDER — ADULT MULTIVITAMIN LIQUID CH
15.0000 mL | Freq: Every day | ORAL | Status: DC
  Administered 2016-08-28 – 2016-08-31 (×4): 15 mL
  Filled 2016-08-28 (×5): qty 15

## 2016-08-28 MED ORDER — POTASSIUM CHLORIDE 20 MEQ/15ML (10%) PO SOLN: 40.0000 meq | Freq: Two times a day (BID) | ORAL | Status: DC

## 2016-08-28 NOTE — Progress Notes (Signed)
Pharmacy Antibiotic Note  Tammie Gonzalez is a 83 y.o. female admitted on 08/08/2016 with respiratory distress and was subsequently intubated with vancomycin and zosyn started for PNA.  Vancomycin d/ced on 8/26.  Patient's currently on day #4 of zosyn for PNA and empyema.  All cultures have been negative thus far.  - 8/27 CXR: Persistent bilateral pulmonary infiltrates/ edema and bilateral pleural effusions without interim change. - Tm 100.9, WBC elevated but improved, SCr low/stable (malnourished)   Plan: - continue with zosyn 3.375 gm IV q8h (infuse over 4 hrs) - with stable renal function, pharmacy will sign off for zosyn.  Reconsult [place] if need further assistance.  ______________________________  Height: 5\' 5"  (165.1 cm) Weight: 82 lb 14.3 oz (37.6 kg) IBW/kg (Calculated) : 57  Temp (24hrs), Avg:99.6 F (37.6 C), Min:97.9 F (36.6 C), Max:100.9 F (38.3 C)   Recent Labs Lab 08/03/2016 1205 08/10/2016 1214 08/30/2016 1215 08/31/2016 1840 08/26/16 0533 08/27/16 0500 08/28/16 0500  WBC 52.7*  --   --   --  17.9* 12.9* 13.3*  CREATININE 0.75 0.70  --   --  0.56 0.46 0.38*  LATICACIDVEN  --   --  6.74* 3.8*  --   --   --     Estimated Creatinine Clearance: 32.2 mL/min (A) (by C-G formula based on SCr of 0.38 mg/dL (L)).    No Known Allergies  Antimicrobials this admission: 8/24 Vancomycin >> 8/26 8/24 Zosyn >>    Dose adjustments this admission:  --  Microbiology results: 8/24 BCx x2:  8/24 UCx: multi species, need recollection 8/24 MRSA PCR: neg 8/24 Thoracentesis:   Thank you for allowing pharmacy to be a part of this patient's care.  Lucia Gaskins 08/28/2016 8:25 AM

## 2016-08-28 NOTE — Progress Notes (Signed)
PULMONARY / CRITICAL CARE MEDICINE   Name: Tammie Gonzalez MRN: [id number] DOB: May 08, 1931    ADMISSION DATE:  08/05/2016 CONSULTATION DATE:  08/06/2016  REFERRING MD:  Dr. Dalene Seltzer / EDP   CHIEF COMPLAINT:  AMS  HISTORY OF PRESENT ILLNESS:   83 yo female brought to ER with altered mental status, hypotension, hypothermia, lactic acidosis, respiratory failure with compromised airway from pneumonia with concern for aspiration, empyema, and septic shock.  Course complicated by A fib with RVR.  SUBJECTIVE: Noted documentation of skin tear on face from ETT holder.  Tmax 100.9, WBC 13.3, platelets decreased to 13. RT reports pt failed SBT with increased accessory muscle use   VITAL SIGNS: BP (!) 99/31   Pulse 82   Temp 99.9 F (37.7 C)   Resp (!) 33 Comment: anxious  Ht 5\' 5"  (1.651 m)   Wt 82 lb 14.3 oz (37.6 kg)   SpO2 100%   BMI 13.79 kg/m   VENTILATOR SETTINGS: Vent Mode: PCV FiO2 (%):  [30 %] 30 % Set Rate:  [22 bmp-30 bmp] 22 bmp PEEP:  [5 cmH20] 5 cmH20 Plateau Pressure:  [21 cmH20-23 cmH20] 23 cmH20  INTAKE / OUTPUT: I/O last 3 completed shifts: In: 4764.3 [I.V.:2449.3; NG/GT:820; IV Piggyback:1495] Out: 745 [Urine:745]  PHYSICAL EXAMINATION: General: cachectic female on vent, appears uncomfortable  HEENT: MM pink/moist, ETT  Neuro: opens eyes to voice, nods yes to pain, generalized weakness CV: s1s2 rrr, no m/r/g PULM: even/non-labored, lungs bilaterally clear, diminished lower  QJ:JHER, non-tender, bsx4 active  Extremities: warm/dry, no edema  Skin: no rashes or lesions  LABS:  BMET  Recent Labs Lab 08/14/2016 1205 08/03/2016 1214 08/26/16 0533 08/27/16 0500  NA 134* 132* 136 139  K 4.2 4.4 4.4 2.8*  CL 102 102 105 109  CO2 22  --  21* 25  BUN 24* 29* 24* 23*  CREATININE 0.75 0.70 0.56 0.46  GLUCOSE 274* 269* 85 152*    Electrolytes  Recent Labs Lab 08/03/2016 1205  08/26/16 0533 08/26/16 1640 08/27/16 0500 08/27/16 1643  CALCIUM 8.4*  --  7.7*   --  7.9*  --   MG 2.8*  < > 2.2  2.3 2.0 2.0 1.9  PHOS  --   < > 3.5  3.4 2.9 1.7* 2.7  < > = values in this interval not displayed.  CBC  Recent Labs Lab 08/26/16 0533 08/27/16 0500 08/28/16 0500  WBC 17.9* 12.9* 13.3*  HGB 11.2* 8.7* 8.0*  HCT 32.7* 26.0* 24.2*  PLT 46* 21* 13*    Coag's No results for input(s): APTT, INR in the last 168 hours.  Sepsis Markers  Recent Labs Lab 08/27/2016 1215 08/30/2016 1319 08/05/2016 1840 08/26/16 0533 08/27/16 0500  LATICACIDVEN 6.74*  --  3.8*  --   --   PROCALCITON  --  22.68  --  38.89 22.56    ABG  Recent Labs Lab 08/06/2016 1210 08/13/2016 2035  PHART 7.100* 7.339*  PCO2ART 67.4* 42.2  PO2ART 252* 70.9*    Liver Enzymes  Recent Labs Lab 08/22/2016 1205 08/26/16 0533  AST 43* 37  ALT 25 22  ALKPHOS 93 64  BILITOT 1.3* 1.7*  ALBUMIN 2.1* 1.8*    Cardiac Enzymes  Recent Labs Lab 08/10/2016 1641 08/06/2016 1840 08/26/16 0107  TROPONINI <0.03 0.03* <0.03    Glucose  Recent Labs Lab 08/27/16 0816 08/27/16 1205 08/27/16 1542 08/27/16 1926 08/27/16 2302 08/28/16 0315  GLUCAP 157* 136* 145* 131* 116* 125*    Imaging Dg  Chest Port 1 View  Result Date: 08/28/2016 CLINICAL DATA:  Respiratory failure. EXAM: PORTABLE CHEST 1 VIEW COMPARISON:  08/27/2016 . FINDINGS: Endotracheal tube and NG tube in stable position. Heart size stable. Right basilar pneumothorax is improved slightly. Bilateral pulmonary infiltrates/edema and bilateral pleural effusions unchanged. IMPRESSION: 1.  Lines and tubes in stable position. 2.  Right basilar pneumothorax has improved slightly. 3. Persistent bilateral pulmonary infiltrates/ edema and bilateral pleural effusions without interim change. Electronically Signed   By: Maisie Fus  Register   On: 08/28/2016 06:30     STUDIES:  CT Head 8/24 >> atrophy with periventricular small vessel disease bilaterally, prior infarct at the gray-white compartment junction of the midportion of the upper  right occipital lobe, prior infarct involving a portion of the left anterior lentiform nucleus and adjacent anterior limb left internal capsule.  No evidence of acute infarct, mass, hemorrhage or fluid collection.   Rt thoracentesis 8/24 >> 1.1 liters, glucose 30, protein 4, LDH 1028, WBC 8531 (92% N) ECHO 8/26 >> LVEF 65-70%, no regional wall motion abnormalities, grade 2 diastolic dysfunction, mild AS, moderate MR, PA peak pressure 55  CULTURES: BCx2 8/24 >>  UC 8/24 >> multiple species, none predominant Sputum 8/24 >> RPR 8/24 >> non-reactive Rt pleural fluid 8/24 >>    ANTIBIOTICS: Vancomycin (empiric) 8/24 >> 8/26 Zosyn (empiric) 8/24 >>   SIGNIFICANT EVENTS: 8/24 Admit with AMS, intubated  8/25 A fib with RVR >> started amiodarone; family meeting >> continue current medical care, but no escalation and DNR  LINES/TUBES: ETT 8/24 >>   DISCUSSION: 83 y/o female with VDRF, sepsis 2nd to aspiration PNA and empyema.  ASSESSMENT / PLAN:  PULMONARY A: Acute hypoxic/hypercapnic respiratory failure from PNA (aspiration) and R empyema. P: PRVC 8 cc/kg  Wean PEEP / FiO2 for sats >92% Follow intermittent CXR Daily SBT / WUA Will need further discussion regarding possible one way extubation (if she is not able to wean)  CARDIOVASCULAR A:  Sepsis 2nd to pneumonia and empyema. A fib with RVR. P:  Reduce LR to 50 ml/hr Amiodarone gtt DNR in the event of arrest   RENAL A:   Metabolic acidosis with lactic acidosis >> resolved. Hypokalemia, hypophosphatemia, hypomagnesemia. P:   Trend BMP / urinary output Replace electrolytes as indicated, KCL 8/27 Avoid nephrotoxic agents, ensure adequate renal perfusion  GASTROINTESTINAL A:   Severe protein calorie malnutrition. P:   Tube feeing per Nutrition   HEMATOLOGIC A:   Anemia of critical illness. Thrombocytopenia most likely from sepsis. P:  Trend CBC SCD's for DVT prophylaxis  PPI  INFECTIOUS A:   Sepsis from  aspiration pneumonia with rt empyema. P:   D4/x abx Negative RPR Monitor fever curve / WBC trend  ENDOCRINE A:   Hyperglycemia. P:   SSI  NEUROLOGIC A:   Acute metabolic encephalopathy 2nd to sepsis. Hx of dementia.  P:   RASS goal: 0 to -1  PRN sedation only  Family - daughter updated at bedside am 8/27 on NP rounding   CC Time: 30 minutes   Canary Brim, NP-C [place] [facility] & [facility] Pgr: [phone] or if no answer [phone] 08/28/2016, 7:46 AM

## 2016-08-28 NOTE — Progress Notes (Signed)
Nutrition Follow-up  DOCUMENTATION CODES:   Severe malnutrition in context of chronic illness, Underweight  INTERVENTION:  - Will change TF regimen: Osmolite 1.5 @ 35 mL/hr which will provide 1260 kcal, 47 grams of protein, and 640 mL free water. - Will order 15 mL liquid multivitamin per OGT/day given TF rate <40 mL/hr.  - Free water per MD/NP, if desired, given weight gain since admission.  Monitor magnesium, potassium, and phosphorus daily, MD to replete as needed, as pt is at risk for refeeding syndrome given possible/likely refeeding earlier in hospitalization, severe malnutrition, very poor nutrition intake PTA.   NUTRITION DIAGNOSIS:   Malnutrition (severe) related to chronic illness (advanced dementia) as evidenced by severe depletion of muscle mass, severe depletion of body fat. -ongoing  GOAL:   Patient will meet greater than or equal to 90% of their needs -met with current TF regimen.   MONITOR:   Vent status, TF tolerance, Weight trends, Labs  ASSESSMENT:   83 y/o F who presented to [facility] ER on 8/24 with reports of altered mental status.  At baseline, the patient lives with her daughter and has a history of advanced dementia and prior HTN (not on meds now).  She is typically carried to the bathroom by family.  The patient will also have periods where she will stay up for up to 3 days in a row.  She was up eating breakfast this am and told her daughter that she needed more coffee to wash her food down as she felt it was stuck.  Her daughter was on the phone and then noted that the patient slumped over. She heard the patient gurgling and EMS was activated.  8/27 Pt remains intubated with OGT in place. Weight today is stable from yesterday; continue to use admission weight of 68 lbs/30.9 kg to re-estimate kcal need this AM. She is currently receiving Vital 1.5 @ 30 mL/hr which is providing 1080 kcal (87% re-estimated kcal need), 49 grams of protein, and 550 mL free water. Will  adjust TF regimen as outlined above to meet a higher percentage of estimated kcal need without highly exceeding estimated protein need.   Spoke with daughter, who is at bedside. She reports that pt was living with her x1 year and has been living with her sister (pt's other daughter) for the past 6 months. During the time pt was living with daughter who is at bedside she was drinking water, coffee, and Boost throughout the day but would only take 2-3 bites of food at meal times; this has been ongoing since moving to live with other daughter. Pt has hx of becoming choked on foods and needing to cough several times and/or drink a large quantity of liquid to aid. Food and beverage textures were not altered at any point PTA.   Patient is currently intubated on ventilator support MV: 10.2 L/min Temp (24hrs), Avg:99.7 F (37.6 C), Min:97.9 F (36.6 C), Max:100.9 F (38.3 C)  Medications reviewed; Labs reviewed;   IVF: LR @ 50 mL/hr.  Drip: Amiodarone @ 30 mg/hr.   8/26 - Patient's weight has increased to 82 lb, +14 lb since 8/24.  - Used admission weight (68 lb) for estimated needs.  - To better meet needs, will adjust formula and goal rate.  - Pt is receiving Vital AF 1.2 @ 25 ml/hr currently. - Pt is showing signs of refeeding syndrome given low K and Phos levels. Repletion per MD.  - Recommend thiamine supplementation.   Patient is currently  intubated on ventilator support MV: 10.1 L/min Temp (24hrs), Avg:99.8 F (37.7 C), Min:98.8 F (37.1 C), Max:100.6 F (38.1 C)   8/24 - Pt was intubated in the ED and OGT placed.  - Unable to talk with family at this time.  - RN obtained weight: 68 lbs (30.91 kg). - No previous weight hx for comparison.  - Physical assessment shows moderate and severe muscle and severe fat wasting throughout. No edema but abdomen distended.   Patient is currently intubated on ventilator support MV: 10 L/min Temp (24hrs), Avg:95.4 F (35.2 C), Min:95.4 F  (35.2 C), Max:95.4 F (35.2 C) Propofol: none BP: 82/51 and MAP: 56 IVF: NS @ 50 mL/hr.    Diet Order:  Diet NPO time specified  Skin:  Reviewed, no issues  Last BM:  8/27  Height:   Ht Readings from Last 1 Encounters:  08/31/2016 '5\' 5"'$  (1.651 m)    Weight:   Wt Readings from Last 1 Encounters:  08/28/16 82 lb 14.3 oz (37.6 kg)    Ideal Body Weight:  56.82 kg  BMI:  Body mass index is 13.79 kg/m.  Estimated Nutritional Needs:   Kcal:  6122  Protein:  40-50g  Fluid:  >/= 1.2 L/day  EDUCATION NEEDS:   No education needs identified at this time    Jarome Matin, MS, RD, LDN, CNSC Inpatient Clinical Dietitian Pager # [phone] After hours/weekend pager # [phone]

## 2016-08-28 NOTE — Care Management Note (Signed)
Case Management Note  Patient Details  Name: Tammie Gonzalez MRN: [id number] Date of Birth: [birth year]  Subjective/Objective:    Respiratory failure and intubation with vent full support.                Action/Plan: Date:  August 28, 2016 Chart reviewed for concurrent status and case management needs. Will continue to follow patient progress. Discharge Planning: following for needs Expected discharge date: [id number] Marcelle Smiling, BSN, [place], [place]   [phone]  Expected Discharge Date:   (unknown)               Expected Discharge Plan:  Home/Self Care  In-House Referral:     Discharge planning Services  CM Consult  Post Acute Care Choice:    Choice offered to:     DME Arranged:    DME Agency:     HH Arranged:    HH Agency:     Status of Service:  In process, will continue to follow  If discussed at Long Length of Stay Meetings, dates discussed:    Additional Comments:  Golda Acre, RN 08/28/2016, 7:54 AM

## 2016-08-29 ENCOUNTER — Inpatient Hospital Stay (HOSPITAL_COMMUNITY): Payer: Medicare Other

## 2016-08-29 LAB — CBC
HCT: 27.6 % — ABNORMAL LOW (ref 36.0–46.0)
HEMOGLOBIN: 8.9 g/dL — AB (ref 12.0–15.0)
MCH: 30.8 pg (ref 26.0–34.0)
MCHC: 32.2 g/dL (ref 30.0–36.0)
MCV: 95.5 fL (ref 78.0–100.0)
Platelets: 7 10*3/uL — CL (ref 150–400)
RBC: 2.89 MIL/uL — AB (ref 3.87–5.11)
RDW: 14.6 % (ref 11.5–15.5)
WBC: 17.2 10*3/uL — ABNORMAL HIGH (ref 4.0–10.5)

## 2016-08-29 LAB — GLUCOSE, CAPILLARY
GLUCOSE-CAPILLARY: 130 mg/dL — AB (ref 65–99)
Glucose-Capillary: 120 mg/dL — ABNORMAL HIGH (ref 65–99)
Glucose-Capillary: 123 mg/dL — ABNORMAL HIGH (ref 65–99)
Glucose-Capillary: 158 mg/dL — ABNORMAL HIGH (ref 65–99)
Glucose-Capillary: 148 mg/dL — ABNORMAL HIGH (ref 65–99)
Glucose-Capillary: 135 mg/dL — ABNORMAL HIGH (ref 65–99)

## 2016-08-29 LAB — BASIC METABOLIC PANEL WITH GFR
Anion gap: 4 — ABNORMAL LOW (ref 5–15)
BUN: 8 mg/dL (ref 6–20)
CO2: 31 mmol/L (ref 22–32)
Calcium: 8.2 mg/dL — ABNORMAL LOW (ref 8.9–10.3)
Chloride: 109 mmol/L (ref 101–111)
Creatinine, Ser: <0.3 mg/dL — ABNORMAL LOW (ref 0.44–1.00)
Glucose, Bld: 138 mg/dL — ABNORMAL HIGH (ref 65–99)
Potassium: 3.7 mmol/L (ref 3.5–5.1)
Sodium: 144 mmol/L (ref 135–145)

## 2016-08-29 LAB — PROCALCITONIN: Procalcitonin: 4.14 ng/mL

## 2016-08-29 NOTE — Progress Notes (Signed)
Daughter, Santina Evans is agreeable to family meeting Thursday at 11 am with sister, Almira Coaster, and CCM.

## 2016-08-29 NOTE — Procedures (Signed)
Peripheral IV inserted to R FA.  #20g IV x1 stick.  Good blood return and flushes easily.  Clean technique maintained for insertion.    Canary Brim, NP-C [place] [facility] Pgr: [phone] or if no answer [phone] 08/29/2016, 2:14 PM

## 2016-08-29 NOTE — Progress Notes (Signed)
PULMONARY / CRITICAL CARE MEDICINE   Name: Tammie Gonzalez MRN: [id number] DOB: 05/12/1931    ADMISSION DATE:  09/11/16 CONSULTATION DATE:  2016/09/11  REFERRING MD:  Dr. Dalene Seltzer / EDP   CHIEF COMPLAINT:  AMS  HISTORY OF PRESENT ILLNESS:   83 yo female brought to ER with altered mental status, hypotension, hypothermia, lactic acidosis, respiratory failure with compromised airway from pneumonia with concern for aspiration, empyema, and septic shock.  Course complicated by A fib with RVR.  SUBJECTIVE:  RN reports no IV access / prior was leaking. Pt weaning on PSV 5/5 this am (just turned over).  Vt ~260, R 30. Amiodarone gtt turned off overnight duet to soft pressures.   VITAL SIGNS: BP (!) 99/34   Pulse 84   Temp 99.9 F (37.7 C)   Resp (!) 24   Ht 5\' 5"  (1.651 m)   Wt 88 lb 10 oz (40.2 kg)   SpO2 99%   BMI 14.75 kg/m   VENTILATOR SETTINGS: Vent Mode: PCV FiO2 (%):  [30 %] 30 % Set Rate:  [20 bmp] 20 bmp PEEP:  [5 cmH20] 5 cmH20 Plateau Pressure:  [20 cmH20-25 cmH20] 22 cmH20  INTAKE / OUTPUT: I/O last 3 completed shifts: In: 2968.3 [I.V.:1863.3; Other:80; NG/GT:875; IV Piggyback:150] Out: 655 [Urine:655]  PHYSICAL EXAMINATION: General: cachectic elderly female in NAD HEENT: MM pink/moist, ETT, no jvd Neuro: eyes open, no follow commands, grimaces  CV: s1s2 rrr, no m/r/g PULM: even/non-labored, lungs bilaterally clear OH:YWVP, non-tender, bsx4 active  Extremities: warm/dry, no edema  Skin: no rashes or lesions  LABS:  BMET  Recent Labs Lab 08/26/16 0533 08/27/16 0500 08/28/16 0500  NA 136 139 137  K 4.4 2.8* 3.8  CL 105 109 104  CO2 21* 25 27  BUN 24* 23* 12  CREATININE 0.56 0.46 0.38*  GLUCOSE 85 152* 293*    Electrolytes  Recent Labs Lab 08/26/16 0533 08/26/16 1640 08/27/16 0500 08/27/16 1643 08/28/16 0500  CALCIUM 7.7*  --  7.9*  --  7.7*  MG 2.2  2.3 2.0 2.0 1.9  --   PHOS 3.5  3.4 2.9 1.7* 2.7  --     CBC  Recent Labs Lab  08/26/16 0533 08/27/16 0500 08/28/16 0500  WBC 17.9* 12.9* 13.3*  HGB 11.2* 8.7* 8.0*  HCT 32.7* 26.0* 24.2*  PLT 46* 21* 13*    Coag's No results for input(s): APTT, INR in the last 168 hours.  Sepsis Markers  Recent Labs Lab 09-11-16 1215 09/11/2016 1319 09-11-16 1840 08/26/16 0533 08/27/16 0500  LATICACIDVEN 6.74*  --  3.8*  --   --   PROCALCITON  --  22.68  --  38.89 22.56    ABG  Recent Labs Lab Sep 11, 2016 1210 09-11-2016 2035  PHART 7.100* 7.339*  PCO2ART 67.4* 42.2  PO2ART 252* 70.9*    Liver Enzymes  Recent Labs Lab 2016-09-11 1205 08/26/16 0533  AST 43* 37  ALT 25 22  ALKPHOS 93 64  BILITOT 1.3* 1.7*  ALBUMIN 2.1* 1.8*    Cardiac Enzymes  Recent Labs Lab Sep 11, 2016 1641 11-Sep-2016 1840 08/26/16 0107  TROPONINI <0.03 0.03* <0.03    Glucose  Recent Labs Lab 08/28/16 0816 08/28/16 1136 08/28/16 1535 08/28/16 1926 08/28/16 2338 08/29/16 0319  GLUCAP 112* 114* 152* 130* 123* 120*    Imaging Dg Chest Port 1 View  Result Date: 08/29/2016 CLINICAL DATA:  Empyema.  Intubation. EXAM: PORTABLE CHEST 1 VIEW COMPARISON:  08/28/2016.  08/27/2016.  08/26/2016. FINDINGS: Endotracheal tube, NG  tube in stable position. Small catheter again noted over the left neck in unchanged position. Stable cardiomegaly. Slight worsening of bilateral pulmonary infiltrates/edema. Stable bilateral pleural effusions unchanged. Right basilar pneumothorax unchanged. Given the air in the right pleural space, developing empyema cannot be excluded . IMPRESSION: 1.  Lines and tubes in stable position. 2. Stable right basilar pneumothorax. Given the air in the right pleural space developing empyema cannot be completely excluded.Stable bilateral pleural effusions . 3. Cardiomegaly. Slight worsening of bilateral pulmonary infiltrates/ edema. Electronically Signed   By: Maisie Fus  Register   On: 08/29/2016 07:21     STUDIES:  CT Head 8/24 >> atrophy with periventricular small vessel  disease bilaterally, prior infarct at the gray-white compartment junction of the midportion of the upper right occipital lobe, prior infarct involving a portion of the left anterior lentiform nucleus and adjacent anterior limb left internal capsule.  No evidence of acute infarct, mass, hemorrhage or fluid collection.   Rt thoracentesis 8/24 >> 1.1 liters, glucose 30, protein 4, LDH 1028, WBC 8531 (92% N) ECHO 8/26 >> LVEF 65-70%, no regional wall motion abnormalities, grade 2 diastolic dysfunction, mild AS, moderate MR, PA peak pressure 55  CULTURES: BCx2 8/24 >> negative UC 8/24 >> multiple species, none predominant Sputum 8/24 >> RPR 8/24 >> non-reactive Rt pleural fluid 8/24 >> negative  ANTIBIOTICS: Vancomycin (empiric) 8/24 >> 8/26 Zosyn (empiric) 8/24 >>   SIGNIFICANT EVENTS: 8/24 Admit with AMS, intubated  8/25 A fib with RVR >> started amiodarone; family meeting >> continue current medical care, but no escalation and DNR  LINES/TUBES: ETT 8/24 >>   DISCUSSION: 83 y/o female with VDRF, sepsis 2nd to aspiration PNA and empyema.  ASSESSMENT / PLAN:  PULMONARY A: Acute hypoxic/hypercapnic respiratory failure from PNA (aspiration) and R parapneumonic effusion (culture negative). P: PRVC 8cc/kg  Wean PEEP / FiO2 for sats > 92% Follow intermittent CXR Daily SBT / WUA  Initiated discussion regarding one way extubation (if she is not able to wean).  See discussion below.  CARDIOVASCULAR A:  Sepsis 2nd to pneumonia and empyema. A fib with RVR. P:  Will need to reassess for IV access this am   LR at 50 ml/hr  Amiodarone gtt d/c'd overnight due to hypotension / soft normal pressures DNR in the event of arrest  RENAL A:   Metabolic acidosis with lactic acidosis >> resolved. Hypokalemia, hypophosphatemia, hypomagnesemia. P:   Trend BMP / urinary output Replace electrolytes as indicated Avoid nephrotoxic agents, ensure adequate renal perfusion  GASTROINTESTINAL A:    Severe protein calorie malnutrition. P:   Tube feeing per Nutrition   HEMATOLOGIC A:   Anemia of critical illness. Thrombocytopenia most likely from sepsis. P:  Trend CBC  SCD's for DVT prophylaxis  PPI   INFECTIOUS A:   Sepsis from aspiration pneumonia with rt empyema. RPR negative.  P:   D5/x abx  Will monitor PCT for duration of abx Monitor fever curve / WBC trend  ENDOCRINE A:   Hyperglycemia. P:   SSI   NEUROLOGIC A:   Acute metabolic encephalopathy 2nd to sepsis. Hx of dementia.  P:   RASS goal: 0 to -1  PRN sedation only  Family - daughter updated at bedside on am rounds 8/28. Reviewed extubation criteria with daughter at bedside.  Discussed concerns regarding baseline patient weakness and ability to do the work for weaning.  She was just turned to PSV wean with small lung volumes / rate 30 but no distress.  Occasional grimaces.  I am concerned that she will be high risk for failure if extubated.  Reviewed the possibility of getting the patient to the best point possible with one way extubation.  Encouraged the daughter to discuss with her sister and family.  Reviewed balancing treatment with inflicting unnecessary stress / pain given patients baseline.    CC Time:  30 minutes   Canary Brim, NP-C  Pulmonary & Critical Care Pgr: [phone] or if no answer [phone] 08/29/2016, 8:36 AM

## 2016-08-29 NOTE — Progress Notes (Signed)
BP running soft with MAP 51 HR 88 an dlooks sinus Code - DNR without escalation  Plan Dc amio gtt   Dr. Kalman Shan, M.D., [facility].C.P Pulmonary and Critical Care Medicine Staff Physician [facility] Pager: [phone], If no answer or between  15:00h - 7:00h: call [phone]  08/29/2016 1:23 AM

## 2016-08-29 NOTE — Progress Notes (Signed)
Nutrition Follow-up  DOCUMENTATION CODES:   Severe malnutrition in context of chronic illness, Underweight  INTERVENTION:  - Maintain current TF regimen (meeting protein estimated protein need and 106% re-estimated kcal need). - Continue to monitor POC/GOC.  NUTRITION DIAGNOSIS:   Malnutrition (severe) related to chronic illness (advanced dementia) as evidenced by severe depletion of muscle mass, severe depletion of body fat. -ongoing  GOAL:   Patient will meet greater than or equal to 90% of their needs -met with TF regimen   MONITOR:   Vent status, TF tolerance, Weight trends, Labs  ASSESSMENT:   83 y/o F who presented to [facility] ER on 8/24 with reports of altered mental status.  At baseline, the patient lives with her daughter and has a history of advanced dementia and prior HTN (not on meds now).  She is typically carried to the bathroom by family.  The patient will also have periods where she will stay up for up to 3 days in a row.  She was up eating breakfast this am and told her daughter that she needed more coffee to wash her food down as she felt it was stuck.  Her daughter was on the phone and then noted that the patient slumped over. She heard the patient gurgling and EMS was activated.  8/28 Pt remains intubated with OGT in place. She is receiving Osmolite 1.5 @ 35 mL/hr which is providing 1260 kcal, 47 grams of protein, and 640 mL free water. Weight +6 lbs/2.6 kg from yesterday and +20 lbs/9.3 kg from admission on 8/24. Suspect that only 2-3 lbs are weight gain related to adequate kcal and protein. Noted moderate edema to upper body, specifically in arms, this AM (did not assess lower body). Daughter and grandson at bedside and deny nutrition-related questions at this time.   Reviewed PCCM NP note from this AM. Discussion was started with daughter concerning one-way extubation. Will continue to monitor for plan.   Patient is currently intubated on ventilator support MV: 7.6  L/min Temp (24hrs), Avg:100.4 F (38 C), Min:99 F (37.2 C), Max:101.1 F (38.4 C) BP: 136/42 and MAP: 74  Medications reviewed; sliding scale Novolog, 15 mL liquid multivitamin per QJJ/HER74 mg Protonix per OGT/day.  Labs reviewed; CBG: 120 mg/dL, BMP still pending from this AM.  IVF: LR @ 50 mL/hr.    8/27 - Weight today is stable from yesterday;. - Continue to use admission weight of 68 lbs/30.9 kg to re-estimate kcal need.  - She is currently receiving Vital 1.5 @ 30 mL/hr which is providing 1080 kcal (87% re-estimated kcal need), 49 grams of protein, and 550 mL free water.  - Will adjust TF regimen to meet a higher percentage of estimated kcal need without highly exceeding estimated protein need.  - Spoke with daughter at bedside.  - Pt was living with her x1 year and has been living with her sister (pt's other daughter) for the past 6 months.  - During the time pt was living with daughter who is at bedside she was drinking water, coffee, and Boost throughout the day but would only take 2-3 bites of food at meal times; this has been ongoing since moving to live with other daughter.  - Hx of becoming choked on foods and needing to cough several times and/or drink a large quantity of liquid to aid.  - Food and beverage textures were not altered at any point PTA.   Patient is currently intubated on ventilator support MV: 10.2 L/min Temp (24hrs),  Avg:99.7 F (37.6 C), Min:97.9 F (36.6 C), Max:100.9 F (38.3 C)  IVF: LR @ 50 mL/hr.  Drip: Amiodarone @ 30 mg/hr.   8/26 - Patient's weight has increased to 82 lb, +14 lb since 8/24.  - Used admission weight (68 lb) for estimated needs.  - To better meet needs, will adjust formula and goal rate.  - Pt is receiving Vital AF 1.2 @ 25 ml/hr currently. - Pt is showing signs of refeeding syndrome given low K and Phos levels. Repletion per MD.  - Recommend thiamine supplementation.   Patient is currently intubated on ventilator  support MV: 10.1L/min Temp (24hrs), Avg:99.8 F (37.7 C), Min:98.8 F (37.1 C), Max:100.6 F (38.1 C)   Diet Order:  Diet NPO time specified  Skin:  Reviewed, no issues  Last BM:  8/27  Height:   Ht Readings from Last 1 Encounters:  08/16/2016 5' 5" (1.651 m)    Weight:   Wt Readings from Last 1 Encounters:  08/29/16 88 lb 10 oz (40.2 kg)    Ideal Body Weight:  56.82 kg  BMI:  Body mass index is 14.75 kg/m.  Estimated Nutritional Needs:   Kcal:  1182  Protein:  40-50g  Fluid:  >/= 1.2 L/day  EDUCATION NEEDS:   No education needs identified at this time    Jarome Matin, MS, RD, LDN, CNSC Inpatient Clinical Dietitian Pager # [phone] After hours/weekend pager # [phone]

## 2016-08-30 ENCOUNTER — Inpatient Hospital Stay (HOSPITAL_COMMUNITY): Payer: Medicare Other

## 2016-08-30 LAB — CULTURE, BLOOD (ROUTINE X 2)
CULTURE: NO GROWTH
CULTURE: NO GROWTH
SPECIAL REQUESTS: ADEQUATE

## 2016-08-30 LAB — GLUCOSE, CAPILLARY
GLUCOSE-CAPILLARY: 118 mg/dL — AB (ref 65–99)
GLUCOSE-CAPILLARY: 133 mg/dL — AB (ref 65–99)
GLUCOSE-CAPILLARY: 128 mg/dL — AB (ref 65–99)
GLUCOSE-CAPILLARY: 133 mg/dL — AB (ref 65–99)
Glucose-Capillary: 167 mg/dL — ABNORMAL HIGH (ref 65–99)
Glucose-Capillary: 124 mg/dL — ABNORMAL HIGH (ref 65–99)

## 2016-08-30 LAB — CBC
HEMATOCRIT: 25.2 % — AB (ref 36.0–46.0)
HEMOGLOBIN: 8.1 g/dL — AB (ref 12.0–15.0)
MCH: 30.8 pg (ref 26.0–34.0)
MCHC: 32.1 g/dL (ref 30.0–36.0)
MCV: 95.8 fL (ref 78.0–100.0)
Platelets: 10 10*3/uL — CL (ref 150–400)
RBC: 2.63 MIL/uL — AB (ref 3.87–5.11)
RDW: 14.6 % (ref 11.5–15.5)
WBC: 15.9 10*3/uL — ABNORMAL HIGH (ref 4.0–10.5)

## 2016-08-30 LAB — BASIC METABOLIC PANEL
ANION GAP: 6 (ref 5–15)
BUN: 10 mg/dL (ref 6–20)
CALCIUM: 8 mg/dL — AB (ref 8.9–10.3)
CO2: 31 mmol/L (ref 22–32)
Chloride: 107 mmol/L (ref 101–111)
Creatinine, Ser: 0.33 mg/dL — ABNORMAL LOW (ref 0.44–1.00)
GLUCOSE: 128 mg/dL — AB (ref 65–99)
POTASSIUM: 3.7 mmol/L (ref 3.5–5.1)
Sodium: 144 mmol/L (ref 135–145)

## 2016-08-30 LAB — PROCALCITONIN: PROCALCITONIN: 2.43 ng/mL

## 2016-08-30 MED ORDER — FUROSEMIDE 10 MG/ML IJ SOLN
20.0000 mg | Freq: Once | INTRAMUSCULAR | Status: AC
  Administered 2016-08-30: 20 mg via INTRAVENOUS
  Filled 2016-08-30: qty 2

## 2016-08-30 MED ORDER — ACETAMINOPHEN 160 MG/5ML PO SOLN
650.0000 mg | Freq: Four times a day (QID) | ORAL | Status: DC | PRN
Start: 2016-08-30 — End: 2016-09-01
  Administered 2016-08-30 – 2016-09-01 (×5): 650 mg
  Filled 2016-08-30 (×6): qty 20.3

## 2016-08-30 NOTE — Progress Notes (Signed)
PULMONARY / CRITICAL CARE MEDICINE   Name: Tammie Gonzalez MRN: [id number] DOB: 10-Aug-1931    ADMISSION DATE:  08/26/2016 CONSULTATION DATE:  08/27/2016  REFERRING MD:  Dr. Dalene Seltzer / EDP   CHIEF COMPLAINT:  AMS  HISTORY OF PRESENT ILLNESS:   83 yo female brought to ER with altered mental status, hypotension, hypothermia, lactic acidosis, respiratory failure with compromised airway from pneumonia with concern for aspiration, empyema, and septic shock.  Course complicated by A fib with RVR.  SUBJECTIVE:  Off amio. Very weak with poor options.    VITAL SIGNS: BP (!) 96/46   Pulse 91   Temp (!) 101.1 F (38.4 C)   Resp (!) 26   Ht 5\' 5"  (1.651 m)   Wt 87 lb 11.9 oz (39.8 kg)   SpO2 98%   BMI 14.60 kg/m   VENTILATOR SETTINGS: Vent Mode: PSV;CPAP FiO2 (%):  [30 %] 30 % Set Rate:  [20 bmp] 20 bmp PEEP:  [5 cmH20] 5 cmH20 Pressure Support:  [8 cmH20-10 cmH20] 10 cmH20 Plateau Pressure:  [21 cmH20-23 cmH20] 23 cmH20  INTAKE / OUTPUT: I/O last 3 completed shifts: In: 2728.3 [I.V.:1538.3; NG/GT:940; IV Piggyback:250] Out: 1210 [Urine:1210]  PHYSICAL EXAMINATION: General:  Frail, thin , elderly female on vent HEENT: OTT-> vent WUJ:WJXB agitation Neuro: HOH but follows commands at times CV: HSR HR 102. Appears st, off amio drip PULM: even/non-labored, lungs bilaterally rhonchi JY:NWGN, non-tender, bsx4 active  Extremities: warm/dry, + edema  Skin: no rashes or lesions  LABS:  BMET  Recent Labs Lab 08/28/16 0500 08/29/16 0900 08/30/16 0547  NA 137 144 144  K 3.8 3.7 3.7  CL 104 109 107  CO2 27 31 31   BUN 12 8 10   CREATININE 0.38* <0.30* 0.33*  GLUCOSE 293* 138* 128*    Electrolytes  Recent Labs Lab 08/26/16 1640 08/27/16 0500 08/27/16 1643 08/28/16 0500 08/29/16 0900 08/30/16 0547  CALCIUM  --  7.9*  --  7.7* 8.2* 8.0*  MG 2.0 2.0 1.9  --   --   --   PHOS 2.9 1.7* 2.7  --   --   --     CBC  Recent Labs Lab 08/28/16 0500 08/29/16 0900  08/30/16 0547  WBC 13.3* 17.2* 15.9*  HGB 8.0* 8.9* 8.1*  HCT 24.2* 27.6* 25.2*  PLT 13* 7* 10*    Coag's No results for input(s): APTT, INR in the last 168 hours.  Sepsis Markers  Recent Labs Lab 08/02/2016 1215  08/23/2016 1840  08/27/16 0500 08/29/16 0900 08/30/16 0547  LATICACIDVEN 6.74*  --  3.8*  --   --   --   --   PROCALCITON  --   < >  --   < > 22.56 4.14 2.43  < > = values in this interval not displayed.  ABG  Recent Labs Lab 08/06/2016 1210 08/03/2016 2035  PHART 7.100* 7.339*  PCO2ART 67.4* 42.2  PO2ART 252* 70.9*    Liver Enzymes  Recent Labs Lab 08/04/2016 1205 08/26/16 0533  AST 43* 37  ALT 25 22  ALKPHOS 93 64  BILITOT 1.3* 1.7*  ALBUMIN 2.1* 1.8*    Cardiac Enzymes  Recent Labs Lab 08/19/2016 1641 08/11/2016 1840 08/26/16 0107  TROPONINI <0.03 0.03* <0.03    Glucose  Recent Labs Lab 08/29/16 1257 08/29/16 1654 08/29/16 1932 08/29/16 2302 08/30/16 0305 08/30/16 0750  GLUCAP 158* 148* 130* 135* 118* 133*    Intake/Output Summary (Last 24 hours) at 08/30/16 0855 Last data filed at 08/30/16  0700  Gross per 24 hour  Intake          1858.33 ml  Output              510 ml  Net          1348.33 ml   Imaging Dg Chest Port 1 View  Result Date: 08/30/2016 CLINICAL DATA:  Respiratory failure. EXAM: PORTABLE CHEST 1 VIEW COMPARISON:  08/29/2016. FINDINGS: Endotracheal tube, NG tube in stable position. Heart size stable. Stable bilateral pulmonary infiltrates/edema. Right-sided pleural effusion has increased in size. Right basilar pneumothorax again noted without significant change. Again given the air in the right pleural space empyema cannot be completely excluded. Small left pleural effusion . IMPRESSION: 1.  Lines and tubes in stable position. 2. Progressive right-sided pleural effusion. Stable right basilar pneumothorax. Again given the right pleural space air, developing empyema cannot be completely excluded. Small left pleural effusion. 3.  Heart size stable. Persistent unchanged bilateral pulmonary infiltrates/edema. Electronically Signed   By: Maisie Fus  Register   On: 08/30/2016 06:25     STUDIES:  CT Head 8/24 >> atrophy with periventricular small vessel disease bilaterally, prior infarct at the gray-white compartment junction of the midportion of the upper right occipital lobe, prior infarct involving a portion of the left anterior lentiform nucleus and adjacent anterior limb left internal capsule.  No evidence of acute infarct, mass, hemorrhage or fluid collection.   Rt thoracentesis 8/24 >> 1.1 liters, glucose 30, protein 4, LDH 1028, WBC 8531 (92% N) ECHO 8/26 >> LVEF 65-70%, no regional wall motion abnormalities, grade 2 diastolic dysfunction, mild AS, moderate MR, PA peak pressure 55  CULTURES: BCx2 8/24 >> negative UC 8/24 >> multiple species, none predominant Sputum 8/24 >> RPR 8/24 >> non-reactive Rt pleural fluid 8/24 >> negative  ANTIBIOTICS: Vancomycin (empiric) 8/24 >> 8/26 Zosyn (empiric) 8/24 >>   SIGNIFICANT EVENTS: 8/24 Admit with AMS, intubated  8/25 A fib with RVR >> started amiodarone; family meeting >> continue current medical care, but no escalation and DNR 8/29 not weaning. Off amio drip due to low bp but with cvr off amio.  LINES/TUBES: ETT 8/24 >>   DISCUSSION: 83 y/o female with VDRF, sepsis 2nd to aspiration PNA and empyema.  ASSESSMENT / PLAN:  PULMONARY A: Acute hypoxic/hypercapnic respiratory failure from PNA (aspiration) and R parapneumonic effusion (culture negative). P: PCV 20/5 Wean PEEP / FiO2 for sats > 92% Follow intermittent CXR Daily SBT / WUA  Initiated discussion regarding one way extubation (if she is not able to wean).  See discussion below. Consider low dose lasix but afib with rvr is a good possibility. Her rt effusion is increased 8/29   CARDIOVASCULAR A:  Sepsis 2nd to pneumonia and empyema. A fib with RVR. P:  PIV placed 8/28. Consider PICC LR at 50 ml/hr   Amiodarone gtt d/c'd  DNR in the event of arrest  RENAL Lab Results  Component Value Date   CREATININE 0.33 (L) 08/30/2016   CREATININE <0.30 (L) 08/29/2016   CREATININE 0.38 (L) 08/28/2016    Recent Labs Lab 08/28/16 0500 08/29/16 0900 08/30/16 0547  K 3.8 3.7 3.7     A:   Metabolic acidosis with lactic acidosis >> resolved. Hypokalemia, hypophosphatemia, hypomagnesemia. P:   Trend BMP / urinary output Replace electrolytes as indicated Avoid nephrotoxic agents, ensure adequate renal perfusion  GASTROINTESTINAL A:   Severe protein calorie malnutrition. P:   Tube feeing per Nutrition   HEMATOLOGIC  Recent Labs  08/29/16  0900 08/30/16 0547  HGB 8.9* 8.1*    A:   Anemia of critical illness. Thrombocytopenia most likely from sepsis. P:  Trend CBC , note continued decline 8/29 SCD's for DVT prophylaxis  PPI   INFECTIOUS A:   Sepsis from aspiration pneumonia with rt empyema. RPR negative.  8/29 tmax 101.1 P:   D6/x abx  Will monitor PCT for duration of abx(2.43 on 8/29) Monitor fever curve / WBC trend  ENDOCRINE CBG (last 3)   Recent Labs  08/29/16 2302 08/30/16 0305 08/30/16 0750  GLUCAP 135* 118* 133*     A:   Hyperglycemia. P:   SSI   NEUROLOGIC A:   Acute metabolic encephalopathy 2nd to sepsis. Hx of dementia.  P:   RASS goal: 0 to -1  PRN sedation only  Family - daughter updated at bedside 8/29. Aware any attempt at extubation has high chance of failure with weakened state and advanced age. Plan is for sister to be at bedside 8/30 for further discussions on possible extubation. She is tachypnea on ps 5 and had to be increased to 10 of PS. S Bensyn Bornemann ACNP  CC Time:  30 minutes   Brett CanalesSteve Odel Schmid ACNP [facility] Pager [phone] till 3 pm If no answer page [phone] 08/30/2016, 8:23 AM

## 2016-08-30 NOTE — Progress Notes (Signed)
Rt changed pt ET tube holder. Pt has serve skin break down on her cheeks. Rt cleaned an then placed a pink foam tecoderm on face. Rt placed a new  tube holder on pt. . RN and family at bedside.

## 2016-08-30 NOTE — Progress Notes (Signed)
Patient has become minimally responsive to pain stimulus; not following commands, which is different from baseline.  Pupils are ~45mm round, reactive, and brisk.  Temperature is currently 101.7 (Tylenol administered, and ice packs placed on patient), BP is 100/32 (52), HR NS in the 90's.    Pola Corn MD, Molli Knock, made aware of changes.  No further orders.  Will make oncoming RN aware.

## 2016-08-30 NOTE — Progress Notes (Signed)
Nutrition Follow-up  DOCUMENTATION CODES:   Severe malnutrition in context of chronic illness, Underweight  INTERVENTION:  - Continue Osmolite 1.5 @ 35 mL/hr. - Will monitor GOC following family meeting.   NUTRITION DIAGNOSIS:   Malnutrition (severe) related to chronic illness (advanced dementia) as evidenced by severe depletion of muscle mass, severe depletion of body fat. -ongoing  GOAL:   Patient will meet greater than or equal to 90% of their needs -met with TF regimen  MONITOR:   Vent status, TF tolerance, Weight trends, Labs  ASSESSMENT:   83 y/o F who presented to [facility] ER on 8/24 with reports of altered mental status.  At baseline, the patient lives with her daughter and has a history of advanced dementia and prior HTN (not on meds now).  She is typically carried to the bathroom by family.  The patient will also have periods where she will stay up for up to 3 days in a row.  She was up eating breakfast this am and told her daughter that she needed more coffee to wash her food down as she felt it was stuck.  Her daughter was on the phone and then noted that the patient slumped over. She heard the patient gurgling and EMS was activated.  8/29 Pt remains intubated with OGT in place and is receiving Osmolite 1.5 @ 35 mL/hr. Re-estimated kcal need this AM and TF regimen is now providing 109% estimated kcal need. Will maintain this TF regimen at this time as family meeting is set for tomorrow AM; dependent on plan and GOC following meeting, will adjust TF regimen if warranted. Weight -1 lb/0.4 kg from yesterday; continue to use admission weight of 30.9 kg to estimate needs.   Patient is currently intubated on ventilator support MV: 6.6 L/min Temp (24hrs), Avg:100.5 F (38.1 C), Min:99.9 F (37.7 C), Max:101.1 F (38.4 C) BP: 119/37 and MAP: 62   Medications reviewed; sliding scale Novolog, 15 mL liquid multivitamin per OGT/day, 40 mg Protonix per OGT/day. Labs reviewed; CBG: 118  mg/dL, creatinine: 0.33 mg/dL, Ca: 8 mg/dL.  IVF: LR @ 50 mL/hr.    8/28 - She is receiving Osmolite 1.5 @ 35 mL/hr which is providing 1260 kcal, 47 grams of protein, and 640 mL free water. - Weight +6 lbs/2.6 kg from yesterday and +20 lbs/9.3 kg from admission on 8/24.  - Suspect that only 2-3 lbs are weight gain related to adequate kcal and protein.  - Noted moderate edema to upper body, specifically in arms, this AM (did not assess lower body). - Daughter and grandson at bedside and deny nutrition-related questions at this time.  - Reviewed PCCM NP note from this AM. - Discussion was started with daughter concerning one-way extubation. Will continue to monitor for plan.   Patient is currently intubated on ventilator support MV: 7.6 L/min Temp (24hrs), Avg:100.4 F (38 C), Min:99 F (37.2 C), Max:101.1 F (38.4 C) BP: 136/42 and MAP: 74 IVF: LR @ 50 mL/hr.    8/27 - Weight today is stable from yesterday;. - Continue to use admission weight of 68 lbs/30.9 kg to re-estimate kcal need.  - She is currently receiving Vital 1.5 @ 30 mL/hr which is providing 1080 kcal (87% re-estimated kcal need), 49 grams of protein, and 550 mL free water.  - Will adjust TF regimen to meet a higher percentage of estimated kcal need without highly exceeding estimated protein need.  - Spoke with daughter at bedside.  - Pt was living with her x1  year and has been living with her sister (pt's other daughter) for the past 6 months.  - During the time pt was living with daughter who is at bedside she was drinking water, coffee, and Boost throughout the day but would only take 2-3 bites of food at meal times; this has been ongoing since moving to live with other daughter.  - Hx of becoming choked on foods and needing to cough several times and/or drink a large quantity of liquid to aid.  - Food and beverage textures were not altered at any point PTA.   Patient is currently intubated on ventilator  support MV: 10.2L/min Temp (24hrs), Avg:99.7 F (37.6 C), Min:97.9 F (36.6 C), Max:100.9 F (38.3 C)  IVF: LR @ 50 mL/hr.  Drip: Amiodarone @ 30 mg/hr.    Diet Order:  Diet NPO time specified  Skin:  Reviewed, no issues  Last BM:  8/27  Height:   Ht Readings from Last 1 Encounters:  08/13/2016 '5\' 5"'$  (1.651 m)    Weight:   Wt Readings from Last 1 Encounters:  08/30/16 87 lb 11.9 oz (39.8 kg)    Ideal Body Weight:  56.82 kg  BMI:  Body mass index is 14.6 kg/m.  Estimated Nutritional Needs:   Kcal:  1150  Protein:  40-50g  Fluid:  >/= 1.2 L/day  EDUCATION NEEDS:   No education needs identified at this time    Jarome Matin, MS, RD, LDN, CNSC Inpatient Clinical Dietitian Pager # [phone] After hours/weekend pager # [phone]

## 2016-08-31 ENCOUNTER — Inpatient Hospital Stay (HOSPITAL_COMMUNITY): Payer: Medicare Other

## 2016-08-31 LAB — GLUCOSE, CAPILLARY
GLUCOSE-CAPILLARY: 131 mg/dL — AB (ref 65–99)
GLUCOSE-CAPILLARY: 141 mg/dL — AB (ref 65–99)
GLUCOSE-CAPILLARY: 156 mg/dL — AB (ref 65–99)
Glucose-Capillary: 153 mg/dL — ABNORMAL HIGH (ref 65–99)
Glucose-Capillary: 86 mg/dL (ref 65–99)

## 2016-08-31 LAB — CBC
HCT: 23.3 % — ABNORMAL LOW (ref 36.0–46.0)
HEMOGLOBIN: 7.4 g/dL — AB (ref 12.0–15.0)
MCH: 30.8 pg (ref 26.0–34.0)
MCHC: 31.8 g/dL (ref 30.0–36.0)
MCV: 97.1 fL (ref 78.0–100.0)
PLATELETS: 17 10*3/uL — AB (ref 150–400)
RBC: 2.4 MIL/uL — AB (ref 3.87–5.11)
RDW: 15.1 % (ref 11.5–15.5)
WBC: 12.9 10*3/uL — AB (ref 4.0–10.5)

## 2016-08-31 LAB — BASIC METABOLIC PANEL
ANION GAP: 4 — AB (ref 5–15)
BUN: 14 mg/dL (ref 6–20)
CALCIUM: 8.1 mg/dL — AB (ref 8.9–10.3)
CO2: 35 mmol/L — AB (ref 22–32)
CREATININE: 0.37 mg/dL — AB (ref 0.44–1.00)
Chloride: 105 mmol/L (ref 101–111)
GFR calc non Af Amer: >60 mL/min (ref >60)
Glucose, Bld: 125 mg/dL — ABNORMAL HIGH (ref 65–99)
Potassium: 3.4 mmol/L — ABNORMAL LOW (ref 3.5–5.1)
SODIUM: 144 mmol/L (ref 135–145)

## 2016-08-31 LAB — PHOSPHORUS: PHOSPHORUS: 1.7 mg/dL — AB (ref 2.5–4.6)

## 2016-08-31 LAB — MAGNESIUM: MAGNESIUM: 1.9 mg/dL (ref 1.7–2.4)

## 2016-08-31 LAB — PROCALCITONIN: Procalcitonin: 1.18 ng/mL

## 2016-08-31 MED ORDER — FUROSEMIDE 10 MG/ML IJ SOLN
40.0000 mg | Freq: Two times a day (BID) | INTRAMUSCULAR | Status: DC
  Administered 2016-08-31: 40 mg via INTRAVENOUS
  Filled 2016-08-31: qty 4

## 2016-08-31 NOTE — Progress Notes (Signed)
PULMONARY / CRITICAL CARE MEDICINE   Name: Tanda RockersLupe Yeung MRN: [id number] DOB: 01/13/1931    ADMISSION DATE:  08/04/16 CONSULTATION DATE:  2016-05-08  REFERRING MD:  Dr. Dalene SeltzerSchlossman / EDP   CHIEF COMPLAINT:  AMS  HISTORY OF PRESENT ILLNESS:   83 yo female brought to ER with altered mental status, hypotension, hypothermia, lactic acidosis, respiratory failure with compromised airway from pneumonia with concern for aspiration, empyema, and septic shock.  Course complicated by A fib with RVR.  SUBJECTIVE:  Remains vent dependent  VITAL SIGNS: BP (!) 116/33   Pulse 92   Temp (!) 101.7 F (38.7 C)   Resp (!) 22   Ht 5\' 5"  (1.651 m)   Wt 97 lb 10.6 oz (44.3 kg)   SpO2 98%   BMI 16.25 kg/m   VENTILATOR SETTINGS: Vent Mode: PSV;CPAP FiO2 (%):  [30 %] 30 % Set Rate:  [20 bmp] 20 bmp PEEP:  [5 cmH20] 5 cmH20 Pressure Support:  [10 cmH20] 10 cmH20 Plateau Pressure:  [20 cmH20-22 cmH20] 22 cmH20  INTAKE / OUTPUT: I/O last 3 completed shifts: In: 3370 [I.V.:1800; NG/GT:1320; IV Piggyback:250] Out: 1225 [Urine:1225]  PHYSICAL EXAMINATION: General:  Frail, wasted, elderly female. HOH HEENT: OTT->vent/OGT ->tf PSY:? Neuro: Follows commands at times CV: HSR PULM: even/non-labored, lungs bilaterally decreased WU:JWJXGI:soft, non-tender, bsx4 active  Extremities: warm/dry,++ edema  Skin: no rashes or lesions   LABS:  BMET  Recent Labs Lab 08/28/16 0500 08/29/16 0900 08/30/16 0547  NA 137 144 144  K 3.8 3.7 3.7  CL 104 109 107  CO2 27 31 31   BUN 12 8 10   CREATININE 0.38* <0.30* 0.33*  GLUCOSE 293* 138* 128*    Electrolytes  Recent Labs Lab 08/26/16 1640 08/27/16 0500 08/27/16 1643 08/28/16 0500 08/29/16 0900 08/30/16 0547  CALCIUM  --  7.9*  --  7.7* 8.2* 8.0*  MG 2.0 2.0 1.9  --   --   --   PHOS 2.9 1.7* 2.7  --   --   --     CBC  Recent Labs Lab 08/28/16 0500 08/29/16 0900 08/30/16 0547  WBC 13.3* 17.2* 15.9*  HGB 8.0* 8.9* 8.1*  HCT 24.2* 27.6* 25.2*   PLT 13* 7* 10*    Coag's No results for input(s): APTT, INR in the last 168 hours.  Sepsis Markers  Recent Labs Lab 02018-05-07 1215  02018-05-07 1840  08/27/16 0500 08/29/16 0900 08/30/16 0547  LATICACIDVEN 6.74*  --  3.8*  --   --   --   --   PROCALCITON  --   < >  --   < > 22.56 4.14 2.43  < > = values in this interval not displayed.  ABG  Recent Labs Lab 02018-05-07 1210 02018-05-07 2035  PHART 7.100* 7.339*  PCO2ART 67.4* 42.2  PO2ART 252* 70.9*    Liver Enzymes  Recent Labs Lab 02018-05-07 1205 08/26/16 0533  AST 43* 37  ALT 25 22  ALKPHOS 93 64  BILITOT 1.3* 1.7*  ALBUMIN 2.1* 1.8*    Cardiac Enzymes  Recent Labs Lab 02018-05-07 1641 02018-05-07 1840 08/26/16 0107  TROPONINI <0.03 0.03* <0.03    Glucose  Recent Labs Lab 08/30/16 0750 08/30/16 1140 08/30/16 1649 08/30/16 1936 08/30/16 2318 08/31/16 0324  GLUCAP 133* 167* 124* 128* 133* 131*    Intake/Output Summary (Last 24 hours) at 08/31/16 0813 Last data filed at 08/31/16 0600  Gross per 24 hour  Intake  2165 ml  Output              960 ml  Net             1205 ml   Imaging Dg Chest Port 1 View  Result Date: 08/31/2016 CLINICAL DATA:  Respiratory failure. EXAM: PORTABLE CHEST 1 VIEW COMPARISON:  08/30/2016.  08/29/2016.  08/26/2016. FINDINGS: Endotracheal tube and NG tube in stable position. Heart size stable. Diffuse bilateral pulmonary infiltrates/edema and bilateral pleural effusions again noted without interim change. Persistent right base pneumothorax. Given the presence of pleural air dissociation with pleural effusion, empyema again cannot be excluded. Old non healed left humeral fracture again noted IMPRESSION: Lines and tubes in stable position. 2. Persistent right base pneumothorax. Given presence of pleural air with associated pleural effusion, empyema again cannot be excluded. No interim change. Pleural effusions are stable. 3.  Diffuse bilateral pulmonary infiltrates/edema  unchanged. Electronically Signed   By: Maisie Fus  Register   On: 08/31/2016 07:00     STUDIES:  CT Head 8/24 >> atrophy with periventricular small vessel disease bilaterally, prior infarct at the gray-white compartment junction of the midportion of the upper right occipital lobe, prior infarct involving a portion of the left anterior lentiform nucleus and adjacent anterior limb left internal capsule.  No evidence of acute infarct, mass, hemorrhage or fluid collection.   Rt thoracentesis 8/24 >> 1.1 liters, glucose 30, protein 4, LDH 1028, WBC 8531 (92% N) ECHO 8/26 >> LVEF 65-70%, no regional wall motion abnormalities, grade 2 diastolic dysfunction, mild AS, moderate MR, PA peak pressure 55  CULTURES: BCx2 8/24 >> negative UC 8/24 >> multiple species, none predominant Sputum 8/24 >>not done RPR 8/24 >> non-reactive Rt pleural fluid 8/24 >> negative  ANTIBIOTICS: Vancomycin (empiric) 8/24 >> 8/26 Zosyn (empiric) 8/24 >>   SIGNIFICANT EVENTS: 8/24 Admit with AMS, intubated  8/25 A fib with RVR >> started amiodarone; family meeting >> continue current medical care, but no escalation and DNR 8/29 not weaning. Off amio drip due to low bp but with cvr off amio.  LINES/TUBES: ETT 8/24 >>   DISCUSSION: 83 y/o female with VDRF, sepsis 2nd to aspiration PNA and empyema.Poor prognosis.  ASSESSMENT / PLAN:  PULMONARY A: Acute hypoxic/hypercapnic respiratory failure from PNA (aspiration) and R parapneumonic effusion (culture negative). Rt base pnx per c x r P: PCV 20/5 Wean PEEP / FiO2 for sats > 92% Follow intermittent CXR Daily SBT / WUA 8/30 did not tolerate ps of 5 with Vt 125 cc Initiated discussion regarding one way extubation (if she is not able to wean).  See discussion below. Consider low dose lasix but afib with rvr is a good possibility. Her rt effusion is unchanged and rt pnx persists.   CARDIOVASCULAR A:  Sepsis 2nd to pneumonia and empyema. A fib with RVR. P:  PIV  placed 8/28. Consider PICC if we are to continue aggressive care LR at 50 ml/hr  Amiodarone gtt d/c'd  DNR in the event of arrest  RENAL Lab Results  Component Value Date   CREATININE 0.33 (L) 08/30/2016   CREATININE <0.30 (L) 08/29/2016   CREATININE 0.38 (L) 08/28/2016    Recent Labs Lab 08/28/16 0500 08/29/16 0900 08/30/16 0547  K 3.8 3.7 3.7     A:   Metabolic acidosis with lactic acidosis >> resolved. Hypokalemia, hypophosphatemia, hypomagnesemia. P:   Trend BMP / urinary output Replace electrolytes as indicated Avoid nephrotoxic agents, ensure adequate renal perfusion  GASTROINTESTINAL A:   Severe  protein calorie malnutrition. P:   Tube feeing per Nutrition   HEMATOLOGIC  Recent Labs  08/29/16 0900 08/30/16 0547  HGB 8.9* 8.1*    A:   Anemia of critical illness. Thrombocytopenia most likely from sepsis. P:  Trend CBC , note continued decline 8/29 Plts remain in 7-10 range on 8/30 SCD's for DVT prophylaxis  PPI   INFECTIOUS A:   Sepsis from aspiration pneumonia with rt empyema. RPR negative.  8/29 tmax 101.1 P:   D7/x abx , Consider dc abx and reculture if spikes fever in light of low plts. Will monitor PCT for duration of abx(2.43 on 8/29) Monitor fever curve / WBC trend/requiring tylenol for fevers 8/30, no  Culture data  ENDOCRINE CBG (last 3)   Recent Labs  08/30/16 1936 08/30/16 2318 08/31/16 0324  GLUCAP 128* 133* 131*     A:   Hyperglycemia. P:   SSI   NEUROLOGIC A:   Acute metabolic encephalopathy 2nd to sepsis. Hx of dementia.  Intermittently responsive P:   RASS goal: 0 to -1  PRN sedation only  Family - daughter updated at bedside 8/30. Aware any attempt at extubation has high chance of failure with weakened state and advanced age. Plan is for sister to be at bedside 8/30 for further discussions on possible extubation. She is tachypnea on ps 5 and had to be increased to 10 of PS.  With Vt of 275 cc.   S Daeveon Zweber ACNP     CC Time:  30 minutes   Brett Canales Ervey Fallin ACNP Adolph Pollack PCCM Pager [phone] till 3 pm If no answer page [phone] 08/31/2016, 8:13 AM

## 2016-08-31 NOTE — Care Management Note (Signed)
Case Management Note  Patient Details  Name: Tammie Gonzalez MRN: [id number] Date of Birth: [birth year]  Subjective/Objective:    Remains on full vent support, temp up to 102, bp hypotensive-iv ns 1000cc bolus for                Action/Plan: Date:  August 31, 2016 Chart reviewed for concurrent status and case management needs. Will continue to follow patient progress. Discharge Planning: following for needs Expected discharge date: [id number] Marcelle SmilingRhonda Davis, BSN, [place], [place]   [phone]  Expected Discharge Date:   (unknown)               Expected Discharge Plan:  Home/Self Care  In-House Referral:     Discharge planning Services  CM Consult  Post Acute Care Choice:    Choice offered to:     DME Arranged:    DME Agency:     HH Arranged:    HH Agency:     Status of Service:  In process, will continue to follow  If discussed at Long Length of Stay Meetings, dates discussed:    Additional Comments:  Golda AcreDavis, Rhonda Lynn, RN 08/31/2016, 8:41 AM

## 2016-08-31 NOTE — Progress Notes (Signed)
Pt hypotensive 70/40s with MAPs in the 40s. Dr. Jamison NeighborNestor with [facility] notified. Lasix had been given earlier, will hold next dose of lasix that is scheduled. Otherwise no new intervention. Will continue to monitor.

## 2016-08-31 NOTE — Progress Notes (Signed)
NUTRITION NOTE  Full follow-up done by this RD yesterday (8/29) with associated note at 9:37AM. Pt remains intubated with OGT in place and is receiving Osmolite 1.5 @ 35 mL/hr which is providing 1260 kcal, 47 grams of protein, and 640 mL free water. PCCM met with both of patient's daughters earlier this afternoon. Spoke with RN on the phone and she reports that pt is to get Lasix today and that plan is for likely one-way extubation tomorrow morning.     Jarome Matin, MS, RD, LDN, [facility] Inpatient Clinical Dietitian Pager # [phone] After hours/weekend pager # [phone]

## 2016-09-01 LAB — BASIC METABOLIC PANEL
ANION GAP: 6 (ref 5–15)
BUN: 17 mg/dL (ref 6–20)
CO2: 36 mmol/L — ABNORMAL HIGH (ref 22–32)
Calcium: 7.9 mg/dL — ABNORMAL LOW (ref 8.9–10.3)
Chloride: 104 mmol/L (ref 101–111)
Creatinine, Ser: 0.34 mg/dL — ABNORMAL LOW (ref 0.44–1.00)
GFR calc Af Amer: >60 mL/min (ref >60)
GFR calc non Af Amer: >60 mL/min (ref >60)
GLUCOSE: 127 mg/dL — AB (ref 65–99)
POTASSIUM: 3.5 mmol/L (ref 3.5–5.1)
Sodium: 146 mmol/L — ABNORMAL HIGH (ref 135–145)

## 2016-09-01 LAB — CBC
HEMATOCRIT: 23.1 % — AB (ref 36.0–46.0)
HEMOGLOBIN: 7 g/dL — AB (ref 12.0–15.0)
MCH: 30.6 pg (ref 26.0–34.0)
MCHC: 30.3 g/dL (ref 30.0–36.0)
MCV: 100.9 fL — AB (ref 78.0–100.0)
Platelets: 18 10*3/uL — CL (ref 150–400)
RBC: 2.29 MIL/uL — AB (ref 3.87–5.11)
RDW: 15.1 % (ref 11.5–15.5)
WBC: 13.6 10*3/uL — ABNORMAL HIGH (ref 4.0–10.5)

## 2016-09-01 LAB — GLUCOSE, CAPILLARY
GLUCOSE-CAPILLARY: 124 mg/dL — AB (ref 65–99)
Glucose-Capillary: 126 mg/dL — ABNORMAL HIGH (ref 65–99)

## 2016-09-01 MED ORDER — MORPHINE BOLUS VIA INFUSION
2.0000 mg | INTRAVENOUS | Status: DC | PRN
  Filled 2016-09-01: qty 2

## 2016-09-01 MED ORDER — SODIUM CHLORIDE 0.9 % IV SOLN
10.0000 mg/h | INTRAVENOUS | Status: DC
  Filled 2016-09-01 (×2): qty 10

## 2016-09-01 MED ORDER — POTASSIUM CHLORIDE 20 MEQ/15ML (10%) PO SOLN
40.0000 meq | Freq: Once | ORAL | Status: AC
  Administered 2016-09-01: 40 meq
  Filled 2016-09-01: qty 30

## 2016-09-02 LAB — GLUCOSE, CAPILLARY: Glucose-Capillary: 148 mg/dL — ABNORMAL HIGH (ref 65–99)

## 2016-09-02 NOTE — Plan of Care (Signed)
  Interdisciplinary Goals of Care Family Meeting   Date carried out:: 08/30/2016  Location of the meeting: Bedside  Member's involved: Physician, Bedside Registered Nurse and Family Member or next of kin  Durable Power of Attorney or acting medical decision maker: Daughter Santina EvansCatherine    Discussion: We discussed goals of care for [name] .  We discussed her clinical course including failure to make progression on weaning trials. They have decided that they didn't want to continue life support. We will plan on one way extubation. Will use morphine as needed postextubation for comfort. If her status should deteriorate then we'll start a morphine drip and transition to full comfort measures.  Code status: Full DNR  Disposition: One way extubation 8/31. Full comfort if she deteriorates  Time spent for the meeting: 15 mins  Mykal Batiz 08/13/2016, 10:15 AM

## 2016-09-02 NOTE — Progress Notes (Signed)
   08/08/2016 1048  Vitals  ECG Heart Rate (!) 0  Cardiac Rhythm Asystole   Patient with sudden decrease in oxygen saturation and HR. Patient passed with family at bedside at 1048. Dr. Isaiah SergeMannam made aware. Checklist complete and belongings sent home with daughters.

## 2016-09-02 NOTE — Procedures (Signed)
Extubation Procedure Note  Patient Details:   Name: Latangela Bores DOB: [birth year] MRN: [id number]   Airway Documentation:     Evaluation  O2 sats: stable throughout Complications: No apparent complications Patient did tolerate procedure well. Bilateral Breath Sounds: Clear, Diminished   Yes  Renold GentaDunlap, Lenah Messenger Lawson 08/11/2016, 10:03 AM

## 2016-09-02 NOTE — Progress Notes (Signed)
PULMONARY / CRITICAL CARE MEDICINE   Name: Tammie Gonzalez MRN: [id number] DOB: July 15, 1931    ADMISSION DATE:  2016/09/10 CONSULTATION DATE:  September 10, 2016  REFERRING MD:  Dr. Dalene Seltzer / EDP   CHIEF COMPLAINT:  AMS  HISTORY OF PRESENT ILLNESS:   83 yo female brought to ER with altered mental status, hypotension, hypothermia, lactic acidosis, respiratory failure with compromised airway from pneumonia with concern for aspiration, empyema, and septic shock.  Course complicated by A fib with RVR.  SUBJECTIVE:  Remains vent dependent, more awake.  VITAL SIGNS: BP (!) 103/24   Pulse 89   Temp (!) 100.6 F (38.1 C)   Resp (!) 27   Ht 5\' 5"  (1.651 m)   Wt 97 lb 10.6 oz (44.3 kg)   SpO2 98%   BMI 16.25 kg/m   VENTILATOR SETTINGS: Vent Mode: PCV FiO2 (%):  [30 %] 30 % Set Rate:  [20 bmp] 20 bmp PEEP:  [5 cmH20] 5 cmH20 Pressure Support:  [10 cmH20] 10 cmH20 Plateau Pressure:  [17 cmH20-22 cmH20] 17 cmH20  INTAKE / OUTPUT: I/O last 3 completed shifts: In: 3490 [I.V.:1800; NG/GT:1440; IV Piggyback:250] Out: 1160 [Urine:1160]  PHYSICAL EXAMINATION: General:  Frail wasted female HEENT: OTT-> vent WUJ:WJXBJY responsive Neuro: opens eyes at times CV: HSR PULM: even/non-labored, lungs bilaterally diminished  NW:GNFA, non-tender, bsx4 active  Extremities: warm/dry++ edema  Skin: rt>Lt hand with weeping with areas of ecchymosis    LABS:  BMET  Recent Labs Lab 08/30/16 0547 08/31/16 0735 08/11/2016 0239  NA 144 144 146*  K 3.7 3.4* 3.5  CL 107 105 104  CO2 31 35* 36*  BUN 10 14 17   CREATININE 0.33* 0.37* 0.34*  GLUCOSE 128* 125* 127*    Electrolytes  Recent Labs Lab 08/27/16 0500 08/27/16 1643  08/30/16 0547 08/31/16 0735 08/26/2016 0239  CALCIUM 7.9*  --   < > 8.0* 8.1* 7.9*  MG 2.0 1.9  --   --  1.9  --   PHOS 1.7* 2.7  --   --  1.7*  --   < > = values in this interval not displayed.  CBC  Recent Labs Lab 08/30/16 0547 08/31/16 0735 08/09/2016 0239  WBC  15.9* 12.9* 13.6*  HGB 8.1* 7.4* 7.0*  HCT 25.2* 23.3* 23.1*  PLT 10* 17* 18*    Coag's No results for input(s): APTT, INR in the last 168 hours.  Sepsis Markers  Recent Labs Lab Sep 10, 2016 1215  Sep 10, 2016 1840  08/29/16 0900 08/30/16 0547 08/31/16 0735  LATICACIDVEN 6.74*  --  3.8*  --   --   --   --   PROCALCITON  --   < >  --   < > 4.14 2.43 1.18  < > = values in this interval not displayed.  ABG  Recent Labs Lab 09/10/2016 1210 2016-09-10 2035  PHART 7.100* 7.339*  PCO2ART 67.4* 42.2  PO2ART 252* 70.9*    Liver Enzymes  Recent Labs Lab September 10, 2016 1205 08/26/16 0533  AST 43* 37  ALT 25 22  ALKPHOS 93 64  BILITOT 1.3* 1.7*  ALBUMIN 2.1* 1.8*    Cardiac Enzymes  Recent Labs Lab Sep 10, 2016 1641 09/10/16 1840 08/26/16 0107  TROPONINI <0.03 0.03* <0.03    Glucose  Recent Labs Lab 08/31/16 0823 08/31/16 1552 08/31/16 1944 08/31/16 2321 08/13/2016 0304 08/08/2016 0735  GLUCAP 141* 156* 153* 86 124* 126*    Intake/Output Summary (Last 24 hours) at 08/06/2016 0805 Last data filed at 08/15/2016 0600  Gross per 24  hour  Intake             2310 ml  Output              740 ml  Net             1570 ml   Imaging No results found.   STUDIES:  CT Head 8/24 >> atrophy with periventricular small vessel disease bilaterally, prior infarct at the gray-white compartment junction of the midportion of the upper right occipital lobe, prior infarct involving a portion of the left anterior lentiform nucleus and adjacent anterior limb left internal capsule.  No evidence of acute infarct, mass, hemorrhage or fluid collection.   Rt thoracentesis 8/24 >> 1.1 liters, glucose 30, protein 4, LDH 1028, WBC 8531 (92% N) ECHO 8/26 >> LVEF 65-70%, no regional wall motion abnormalities, grade 2 diastolic dysfunction, mild AS, moderate MR, PA peak pressure 55  CULTURES: BCx2 8/24 >> negative UC 8/24 >> multiple species, none predominant Sputum 8/24 >>not done RPR 8/24 >>  non-reactive Rt pleural fluid 8/24 >> negative  ANTIBIOTICS: Vancomycin (empiric) 8/24 >> 8/26 Zosyn (empiric) 8/24 >>   SIGNIFICANT EVENTS: 8/24 Admit with AMS, intubated  8/25 A fib with RVR >> started amiodarone; family meeting >> continue current medical care, but no escalation and DNR 8/29 not weaning. Off amio drip due to low bp but with cvr off amio. One way extubation 8/31.  LINES/TUBES: ETT 8/24 >>   DISCUSSION: 83 y/o female with VDRF, sepsis 2nd to aspiration PNA and empyema.Poor prognosis. One way extubation 8/31.  ASSESSMENT / PLAN:  PULMONARY A: Acute hypoxic/hypercapnic respiratory failure from PNA (aspiration) and R parapneumonic effusion (culture negative). Rt base pnx per c x r P: PCV 20/5 Wean PEEP / FiO2 for sats > 92% Follow intermittent CXR Daily SBT / WUA 8/30 did not tolerate ps of 8 with Vt 225 cc Initiated discussion regarding one way extubation (if she is not able to wean).  See discussion below.  low dose lasix failed due to hypotension One way extubation 8/31  CARDIOVASCULAR A:  Sepsis 2nd to pneumonia and empyema. A fib with RVR. P:  PIV placed 8/28. Consider PICC if we are to continue aggressive care LR at 50 ml/hr  Amiodarone gtt d/c'd  DNR in the event of arrest  RENAL Lab Results  Component Value Date   CREATININE 0.34 (L) 2016-09-13   CREATININE 0.37 (L) 08/31/2016   CREATININE 0.33 (L) 08/30/2016    Recent Labs Lab 08/30/16 0547 08/31/16 0735 Sep 13, 2016 0239  K 3.7 3.4* 3.5     A:   Metabolic acidosis with lactic acidosis >> resolved. Hypokalemia, hypophosphatemia, hypomagnesemia. P:   Trend BMP / urinary output Replace electrolytes as indicated Avoid nephrotoxic agents, ensure adequate renal perfusion  GASTROINTESTINAL A:   Severe protein calorie malnutrition. P:   Tube feeing per Nutrition   HEMATOLOGIC  Recent Labs  08/31/16 0735 2016-09-13 0239  HGB 7.4* 7.0*    A:   Anemia of critical  illness. Thrombocytopenia most likely from sepsis. P:  Trend CBC , note continued decline 8/31 Plts remain in 7-18 range on 8/31 SCD's for DVT prophylaxis  PPI   INFECTIOUS A:   Sepsis from aspiration pneumonia with rt empyema. RPR negative.  8/29 tmax 101.1 P:   D8/x abx , Consider dc abx and reculture if spikes fever in light of low plts. Will monitor PCT for duration of abx(2.43 on 8/29) Monitor fever curve / WBC trend/requiring tylenol for  fevers 8/30, no  Culture data  ENDOCRINE CBG (last 3)   Recent Labs  08/31/16 2321 2016-05-21 0304 2016-05-21 0735  GLUCAP 86 124* 126*     A:   Hyperglycemia. P:   SSI   NEUROLOGIC A:   Acute metabolic encephalopathy 2nd to sepsis. Hx of dementia.  Intermittently responsive P:   RASS goal: 0 to -1  PRN sedation only No sedation for 24 hours on 8/31  Family - 2 daughters and son in  [place] updated at bedside. They wish for one way extubation with no re intubation.  Diuresis held for hypotension. +1600 last 24 hours. Hgb 7.0. We will have MSO4 drip at bedside and then extubate, if she is in distress then we will change to comfort care mode as all family members desire this. Extended time spent with family. No labs or c x r ordered for am if she survives will order later.   CC Time:  65 minutes   Tammie Gonzalez Tammie Gonzalez PCCM Pager [phone] till 3 pm If no answer page [phone] 04/21/16, 8:05 AM

## 2016-09-02 NOTE — Progress Notes (Signed)
Pt's hemoglobin was 7.0 on AM labs. [facility] RN notified. Not signs of acute bleeding in the patient.

## 2016-09-02 NOTE — Progress Notes (Signed)
Nutrition Follow-up  DOCUMENTATION CODES:   Severe malnutrition in context of chronic illness, Underweight  INTERVENTION:  - Will monitor medical course as pt was just extubated ~10AM today.  NUTRITION DIAGNOSIS:   Malnutrition (severe) related to chronic illness (advanced dementia) as evidenced by severe depletion of muscle mass, severe depletion of body fat. -ongoing  GOAL:   Patient will meet greater than or equal to 90% of their needs -unmet at this time; previously met with TF   MONITOR:   Weight trends, Labs, I & O's  ASSESSMENT:   83 y/o F who presented to [facility] ER on 8/24 with reports of altered mental status.  At baseline, the patient lives with her daughter and has a history of advanced dementia and prior HTN (not on meds now).  She is typically carried to the bathroom by family.  The patient will also have periods where she will stay up for up to 3 days in a row.  She was up eating breakfast this am and told her daughter that she needed more coffee to wash her food down as she felt it was stuck.  Her daughter was on the phone and then noted that the patient slumped over. She heard the patient gurgling and EMS was activated.  8/31 Pt was extubated today at ~10AM with no plan for re-intubation. OGT removed at that time and TF had been held prior to extubation and OGT removal. Weight continues to trend up and is currently +29 lbs/13.4 kg from admission; used admission weight to re-estimate nutrition needs this AM.   Medications reviewed; sliding scale Novolog, 40 mg Protonix/day, 40 mEq KCl x1 dose this AM via OGT. Labs reviewed; CBGs: 124 and 126 mg/dL this AM, Na: 146 mmol/L, creatinine: 0.34 mg/dL, Ca: 7.9 mg/dL.  IVF: LR @ 50 mL/hr.   8/30 - Pt remains intubated with OGT in place and is receiving Osmolite 1.5 @ 35 mL/hr which is providing 1260 kcal, 47 grams of protein, and 640 mL free water.  - PCCM met with both of patient's daughters earlier this afternoon.  - Spoke  with RN: pt is to get Lasix today and plan is for likely one-way extubation tomorrow morning.    8/29 - Pt remains intubated with OGT in place and is receiving Osmolite 1.5 @ 35 mL/hr.  - Re-estimated kcal need this AM and TF regimen is now providing 109% estimated kcal need.  - Weight -1 lb/0.4 kg from yesterday; continue to use admission weight of 30.9 kg to estimate needs.   Patient is currently intubated on ventilator support MV: 6.6 L/min Temp (24hrs), Avg:100.5 F (38.1 C), Min:99.9 F (37.7 C), Max:101.1 F (38.4 C) BP: 119/37 and MAP: 62  IVF: LR @ 50 mL/hr.    Diet Order:  Diet NPO time specified  Skin:  Reviewed, no issues  Last BM:  8/30  Height:   Ht Readings from Last 1 Encounters:  08/11/2016 _0  (1.651 m)    Weight:   Wt Readings from Last 1 Encounters:  08/31/16 97 lb 10.6 oz (44.3 kg)    Ideal Body Weight:  56.82 kg  BMI:  Body mass index is 16.25 kg/m.  Estimated Nutritional Needs:   Kcal:  1080-1235 (35-40 kcal/kg)  Protein:  37-46 grams (1.2-1.5 grams/kg)  Fluid:  >/= 1.2 L/day  EDUCATION NEEDS:   No education needs identified at this time    Jarome Matin, MS, RD, LDN, CNSC Inpatient Clinical Dietitian Pager # [phone] After hours/weekend pager #  [phone]

## 2016-09-02 NOTE — Progress Notes (Signed)
      Recent Labs Lab 08/30/16 0547 08/31/16 0735 08/30/2016 0239  HGB 8.1* 7.4* 7.0*  HCT 25.2* 23.3* 23.1*  WBC 15.9* 12.9* 13.6*  PLT 10* 17* 18*    Recent Labs Lab 08/26/16 0533 08/26/16 1640 08/27/16 0500 08/27/16 1643 08/28/16 0500 08/29/16 0900 08/30/16 0547 08/31/16 0735 08/04/2016 0239  NA 136  --  139  --  137 144 144 144 146*  K 4.4  --  2.8*  --  3.8 3.7 3.7 3.4* 3.5  CL 105  --  109  --  104 109 107 105 104  CO2 21*  --  25  --  27 31 31  35* 36*  GLUCOSE 85  --  152*  --  293* 138* 128* 125* 127*  BUN 24*  --  23*  --  12 8 10 14 17   CREATININE 0.56  --  0.46  --  0.38* <0.30* 0.33* 0.37* 0.34*  CALCIUM 7.7*  --  7.9*  --  7.7* 8.2* 8.0* 8.1* 7.9*  MG 2.2  2.3 2.0 2.0 1.9  --   --   --  1.9  --   PHOS 3.5  3.4 2.9 1.7* 2.7  --   --   --  1.7*  --     A Anemia -0 hgb 7gm% No bledind reported Low K  P Replete K For anemia - - PRBC for hgb </= 6.9gm%    - exceptions are   -  if ACS susepcted/confirmed then transfuse for hgb </= 8.0gm%,  or    -  active bleeding with hemodynamic instability, then transfuse regardless of hemoglobin value   At at all times try to transfuse 1 unit prbc as possible with exception of active hemorrhage    Dr. Kalman ShanMurali Mandy Fitzwater, M.D., [facility] HospitalF.C.C.P Pulmonary and Critical Care Medicine Staff Physician [facility] [place] [facility] Pager: [phone], If no answer or between  15:00h - 7:00h: call [phone]  08/25/2016 4:27 AM

## 2016-09-02 DEATH — deceased

## 2016-09-05 ENCOUNTER — Telehealth: Payer: Self-pay

## 2016-09-05 NOTE — Telephone Encounter (Signed)
On 09/05/16 I received a death certificate from [employer] [address]) (Original). The death certificate is for burial. The patient is a patient of Doctor Maneem. The death certificate will be taken to Pulmonary Unit @ [facility] this pm for signature.  On 09/06/2016 I received the death certificate back from Doctor Maneem. I got the death certificate ready and called the funeral home to let them know the d/c is ready for pickup.

## 2016-09-06 NOTE — Telephone Encounter (Signed)
This note was entered by mistake. °

## 2016-10-02 NOTE — Discharge Summary (Addendum)
Physician Discharge Summary  Patient ID: Tammie RockersLupe Gonzalez MRN: [id number] DOB/AGE: Jan 30, 1931 83 y.o.  Admit date: 06-05-16 Discharge date: 08/31/2016  Admission Diagnoses: Altered mental status  Discharge Diagnoses:  Active Problems:   Acute respiratory failure (HCC)   Protein-calorie malnutrition, severe   Pleural effusion  Discharged Condition: Deceased  Hospital Course:  83 yo female brought to ER with altered mental status, hypotension, hypothermia, lactic acidosis, respiratory failure with compromised airway from pneumonia with concern for aspiration, empyema, and septic shock.  Course complicated by A fib with RVR.  She underwent right thoracentesis on 30-Mar-2016 with removal of 1.1 lt of fluid and developed pneumothorax ex vacuo. Her clinical course continued to be poor and she did not make any progress on weaning trials. There was a family meeting on 8/31 where the family make it clear that they would not wish to continue life support. They decided on withdrawal of care with one way extubation and DNR status. After extubation she went into asystolic arrest and passed away at 10:48 AM  Disposition: 20-Expired   Allergies as of 08/14/2016   No Known Allergies     Medication List    ASK your doctor about these medications   aspirin 81 MG chewable tablet Chew 162 mg by mouth every 6 (six) hours as needed for mild pain, moderate pain, fever or headache.        Signed: Tamy Gonzalez 09/13/2016, 2:17 PM

## 2019-03-17 IMAGING — DX DG CHEST 1V PORT
1 series · 1 of 1 positions shown · non-contrast
Comparison: 08/28/2016.  08/27/2016.  08/26/2016.

CLINICAL DATA: Empyema.  Intubation.

EXAM:
PORTABLE CHEST 1 VIEW

[chest ap]
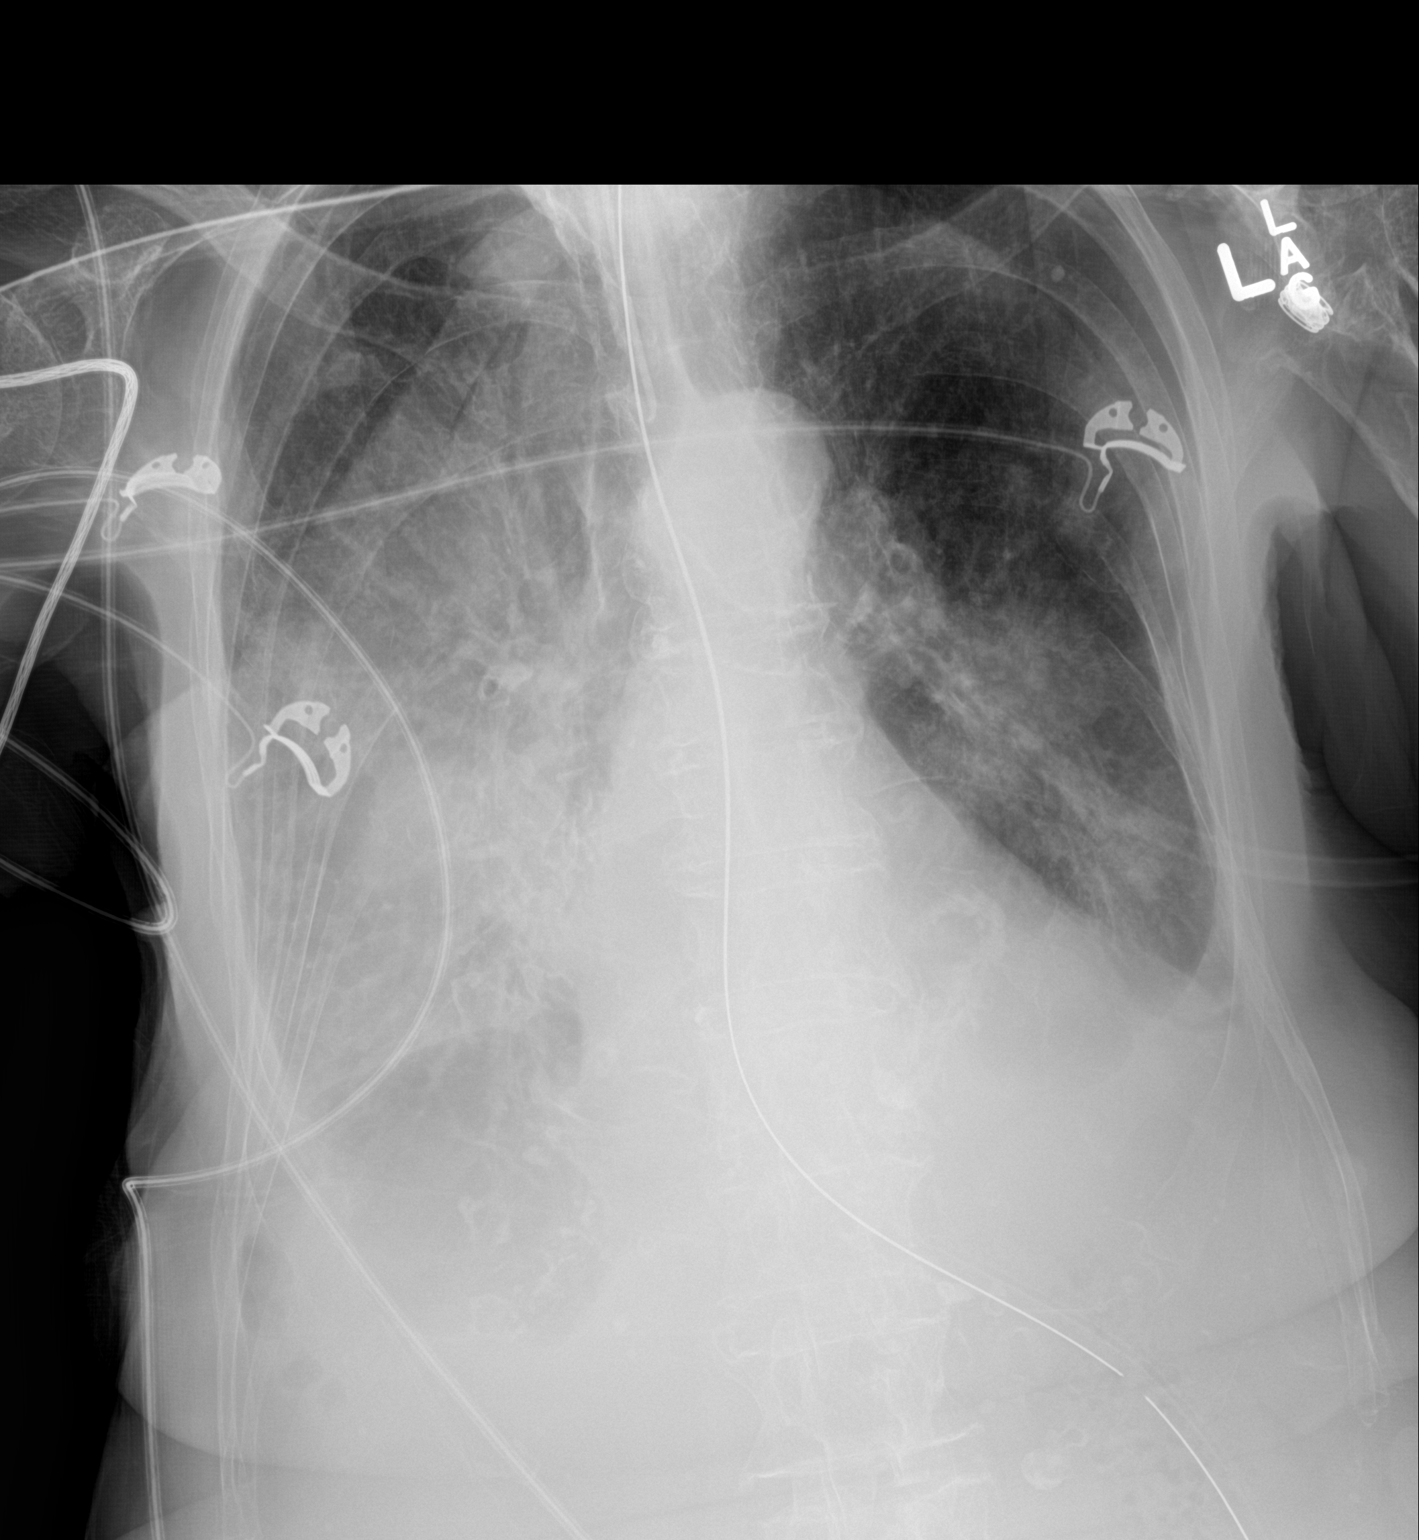

[1 of 1 positions shown; findings below may reference images not displayed]

FINDINGS: Endotracheal tube, NG tube in stable position. Small catheter again
noted over the left neck in unchanged position. Stable cardiomegaly.
Slight worsening of bilateral pulmonary infiltrates/edema. Stable
bilateral pleural effusions unchanged. Right basilar pneumothorax
unchanged. Given the air in the right pleural space, developing
empyema cannot be excluded .
IMPRESSION: 1.  Lines and tubes in stable position.

2. Stable right basilar pneumothorax. Given the air in the right
pleural space developing empyema cannot be completely
excluded.Stable bilateral pleural effusions .

3. Cardiomegaly. Slight worsening of bilateral pulmonary
infiltrates/ edema.

## 2019-03-19 IMAGING — DX DG CHEST 1V PORT
1 series · 1 of 1 positions shown · non-contrast
Comparison: 08/30/2016.  08/29/2016.  08/26/2016.

CLINICAL DATA: Respiratory failure.

EXAM:
PORTABLE CHEST 1 VIEW

[chest ap]
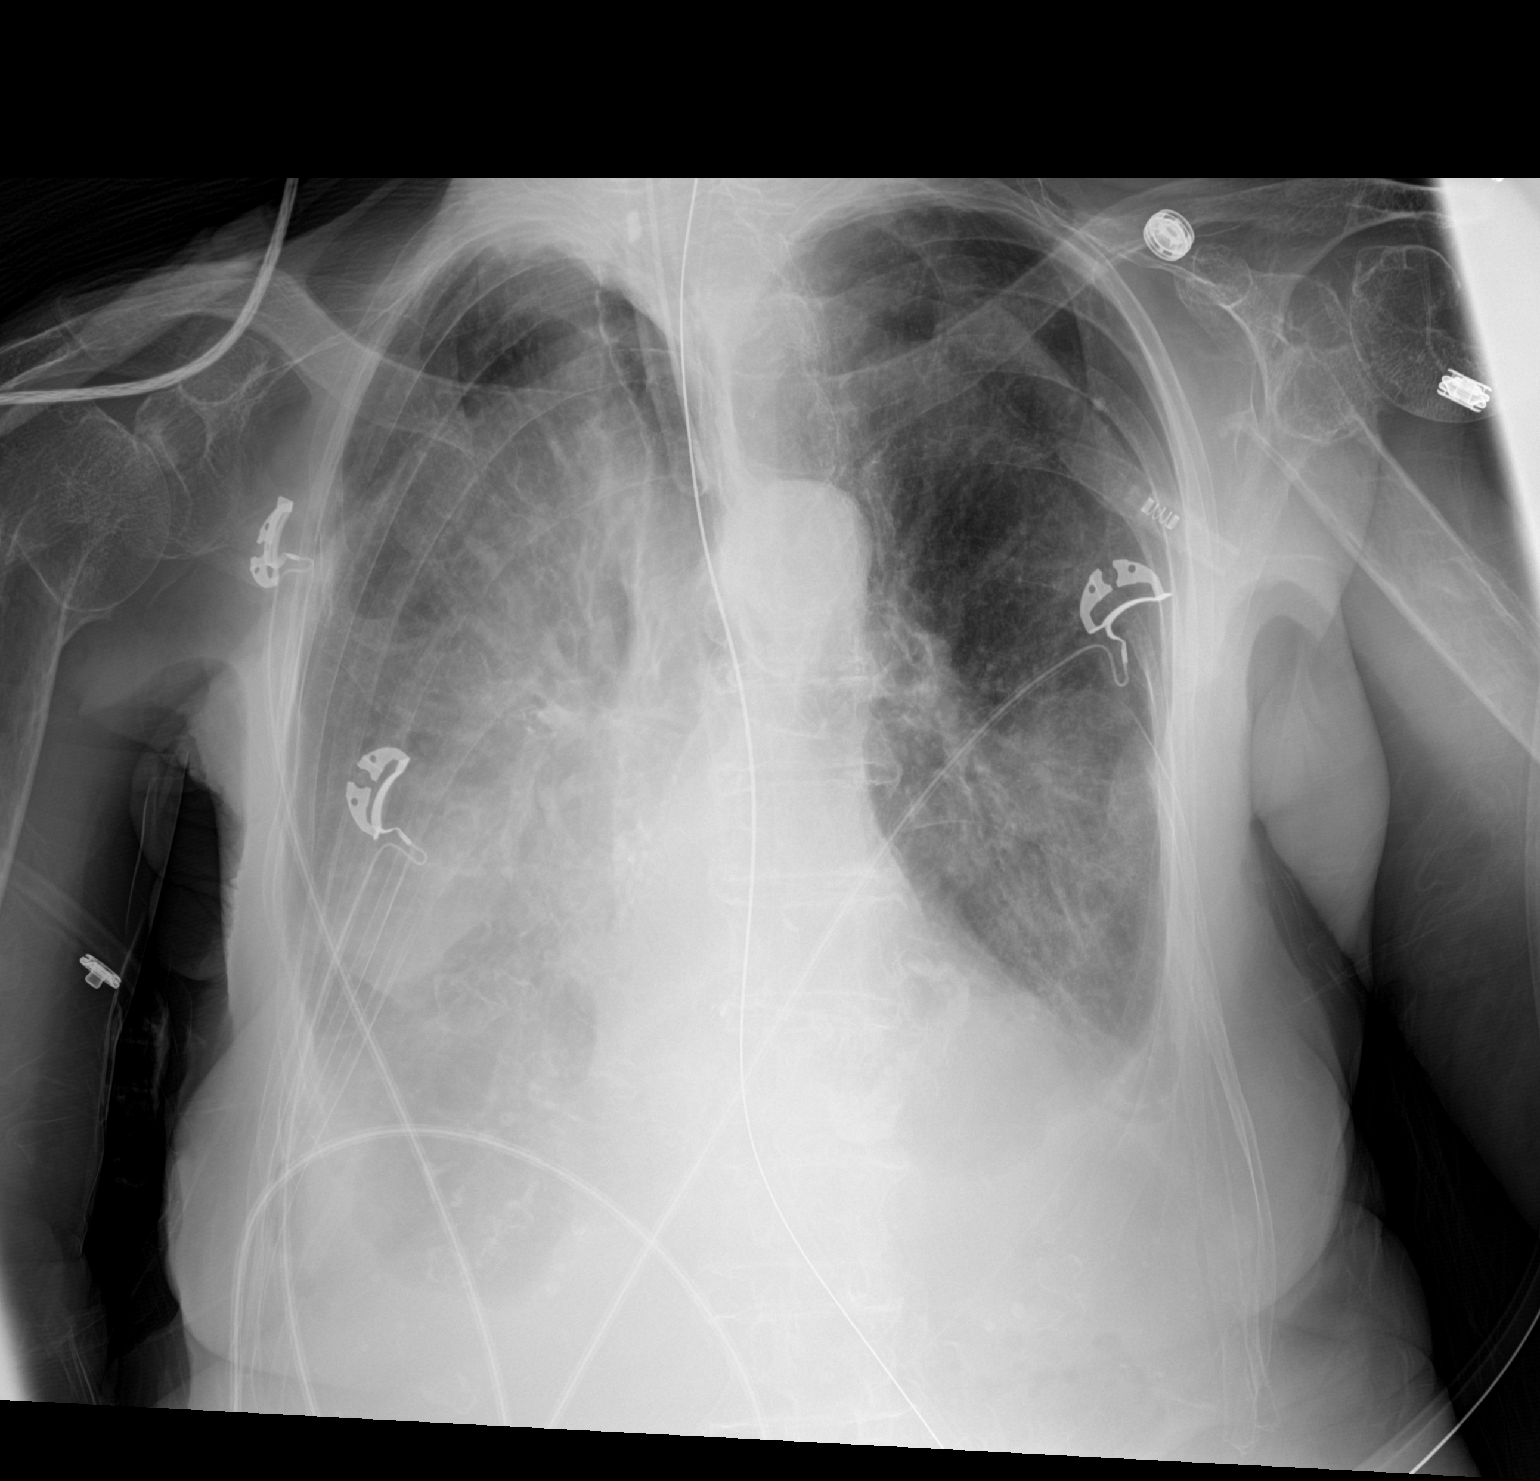

[1 of 1 positions shown; findings below may reference images not displayed]

FINDINGS: Endotracheal tube and NG tube in stable position. Heart size stable.
Diffuse bilateral pulmonary infiltrates/edema and bilateral pleural
effusions again noted without interim change. Persistent right base
pneumothorax. Given the presence of pleural air dissociation with
pleural effusion, empyema again cannot be excluded. Old non healed
left humeral fracture again noted
IMPRESSION: Lines and tubes in stable position.

2. Persistent right base pneumothorax. Given presence of pleural air
with associated pleural effusion, empyema again cannot be excluded.
No interim change. Pleural effusions are stable.

3.  Diffuse bilateral pulmonary infiltrates/edema unchanged.
